# Patient Record
Sex: Male | Born: 1995 | Race: Black or African American | Hispanic: No | Marital: Single | Smoking: Never smoker
Health system: Southern US, Community
[De-identification: ages and names within clinical notes are randomized; demographics above are authoritative.]

## PROBLEM LIST (undated history)

## (undated) DIAGNOSIS — J45909 Unspecified asthma, uncomplicated: Secondary | ICD-10-CM

---

## 2018-06-18 ENCOUNTER — Emergency Department (HOSPITAL_COMMUNITY)
Admission: EM | Admit: 2018-06-18 | Discharge: 2018-06-18 | Disposition: A | Discharge status: Home / Self Care | Payer: Self-pay | Attending: Emergency Medicine | Admitting: Emergency Medicine

## 2018-06-18 ENCOUNTER — Other Ambulatory Visit: Payer: Self-pay

## 2018-06-18 ENCOUNTER — Encounter (HOSPITAL_COMMUNITY): Payer: Self-pay

## 2018-06-18 DIAGNOSIS — X100XXA Contact with hot drinks, initial encounter: Secondary | ICD-10-CM | POA: Insufficient documentation

## 2018-06-18 DIAGNOSIS — Y999 Unspecified external cause status: Secondary | ICD-10-CM | POA: Insufficient documentation

## 2018-06-18 DIAGNOSIS — T24202A Burn of second degree of unspecified site of left lower limb, except ankle and foot, initial encounter: Secondary | ICD-10-CM | POA: Insufficient documentation

## 2018-06-18 DIAGNOSIS — Y929 Unspecified place or not applicable: Secondary | ICD-10-CM | POA: Insufficient documentation

## 2018-06-18 DIAGNOSIS — Y939 Activity, unspecified: Secondary | ICD-10-CM | POA: Insufficient documentation

## 2018-06-18 DIAGNOSIS — J45909 Unspecified asthma, uncomplicated: Secondary | ICD-10-CM | POA: Insufficient documentation

## 2018-06-18 HISTORY — DX: Unspecified asthma, uncomplicated: J45.909

## 2018-06-18 MED ORDER — OXYCODONE-ACETAMINOPHEN 5-325 MG PO TABS: 1.0000 | ORAL_TABLET | Freq: Once | ORAL | Status: DC

## 2018-06-18 MED ORDER — ACETAMINOPHEN 325 MG PO TABS
650.0000 mg | ORAL_TABLET | Freq: Once | ORAL | Status: AC
  Administered 2018-06-18: 650 mg via ORAL
  Filled 2018-06-18: qty 2
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
## 2018-06-18 NOTE — Discharge Instructions (Addendum)
You may alternate ibuprofen or tylenol for your pain.Please keep left leg elevated while sitting at home. You may apply bacitracin or neosporin to the wound.

## 2018-07-21 ENCOUNTER — Emergency Department (HOSPITAL_COMMUNITY): Payer: Self-pay

## 2018-07-21 ENCOUNTER — Encounter (HOSPITAL_COMMUNITY): Payer: Self-pay | Admitting: Emergency Medicine

## 2018-07-21 ENCOUNTER — Emergency Department (HOSPITAL_COMMUNITY)
Admission: EM | Admit: 2018-07-21 | Discharge: 2018-07-22 | Disposition: A | Discharge status: Home / Self Care | Payer: Self-pay | Attending: Emergency Medicine | Admitting: Emergency Medicine

## 2018-07-21 DIAGNOSIS — J45909 Unspecified asthma, uncomplicated: Secondary | ICD-10-CM | POA: Insufficient documentation

## 2018-07-21 DIAGNOSIS — J039 Acute tonsillitis, unspecified: Secondary | ICD-10-CM | POA: Insufficient documentation

## 2018-07-21 DIAGNOSIS — Z7722 Contact with and (suspected) exposure to environmental tobacco smoke (acute) (chronic): Secondary | ICD-10-CM | POA: Insufficient documentation

## 2018-07-21 DIAGNOSIS — S161XXA Strain of muscle, fascia and tendon at neck level, initial encounter: Secondary | ICD-10-CM

## 2018-07-21 MED ORDER — METHOCARBAMOL 1000 MG/10ML IJ SOLN
1000.0000 mg | Freq: Once | INTRAVENOUS | Status: DC
  Filled 2018-07-21 (×2): qty 10

## 2018-07-21 MED ORDER — FENTANYL CITRATE (PF) 100 MCG/2ML IJ SOLN
50.0000 ug | Freq: Once | INTRAMUSCULAR | Status: AC
  Administered 2018-07-21: 50 ug via INTRAVENOUS
  Filled 2018-07-21: qty 2

## 2018-07-21 MED ORDER — METHOCARBAMOL 1000 MG/10ML IJ SOLN: 1000.0000 mg | Freq: Once | INTRAMUSCULAR | Status: DC

## 2018-07-21 NOTE — ED Notes (Signed)
Pt complains of right arm pain and asking for pain medication.
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
## 2018-07-22 ENCOUNTER — Emergency Department (HOSPITAL_COMMUNITY): Payer: Self-pay

## 2018-07-22 LAB — CBC WITH DIFFERENTIAL/PLATELET
Abs Immature Granulocytes: 0.02 10*3/uL (ref 0.00–0.07)
Basophils Absolute: 0 10*3/uL (ref 0.0–0.1)
Basophils Relative: 1 %
Eosinophils Absolute: 0.3 10*3/uL (ref 0.0–0.5)
Eosinophils Relative: 5 %
HCT: 40.2 % (ref 39.0–52.0)
Hemoglobin: 13.7 g/dL (ref 13.0–17.0)
Immature Granulocytes: 0 %
Lymphocytes Relative: 23 %
Lymphs Abs: 1.5 10*3/uL (ref 0.7–4.0)
MCH: 30.9 pg (ref 26.0–34.0)
MCHC: 34.1 g/dL (ref 30.0–36.0)
MCV: 90.5 fL (ref 80.0–100.0)
Monocytes Absolute: 0.3 10*3/uL (ref 0.1–1.0)
Monocytes Relative: 5 %
Neutro Abs: 4.4 10*3/uL (ref 1.7–7.7)
Neutrophils Relative %: 66 %
Platelets: 276 10*3/uL (ref 150–400)
RBC: 4.44 MIL/uL (ref 4.22–5.81)
RDW: 11.7 % (ref 11.5–15.5)
WBC: 6.5 10*3/uL (ref 4.0–10.5)
nRBC: 0 % (ref 0.0–0.2)

## 2018-07-22 LAB — I-STAT CHEM 8, ED
BUN: 9 mg/dL (ref 6–20)
Calcium, Ion: 1.25 mmol/L (ref 1.15–1.40)
Chloride: 102 mmol/L (ref 98–111)
Creatinine, Ser: 1.1 mg/dL (ref 0.61–1.24)
Glucose, Bld: 88 mg/dL (ref 70–99)
HCT: 41 % (ref 39.0–52.0)
Hemoglobin: 13.9 g/dL (ref 13.0–17.0)
Potassium: 3.3 mmol/L — ABNORMAL LOW (ref 3.5–5.1)
Sodium: 140 mmol/L (ref 135–145)
TCO2: 27 mmol/L (ref 22–32)

## 2018-07-22 LAB — ETHANOL: Alcohol, Ethyl (B): <10 mg/dL (ref <10)

## 2018-07-22 MED ORDER — HYDROCODONE-ACETAMINOPHEN 5-325 MG PO TABS: 1.0000 | ORAL_TABLET | Freq: Once | ORAL | Status: DC

## 2018-07-22 MED ORDER — CYCLOBENZAPRINE HCL 10 MG PO TABS: 10.0000 mg | ORAL_TABLET | Freq: Two times a day (BID) | ORAL | 0 refills | Status: DC | PRN

## 2018-07-22 MED ORDER — HYDROMORPHONE HCL 1 MG/ML IJ SOLN
1.0000 mg | Freq: Once | INTRAMUSCULAR | Status: AC
  Administered 2018-07-22: 1 mg via INTRAVENOUS
  Filled 2018-07-22: qty 1

## 2018-07-22 MED ORDER — CYCLOBENZAPRINE HCL 10 MG PO TABS
10.0000 mg | ORAL_TABLET | Freq: Once | ORAL | Status: AC
  Administered 2018-07-22: 10 mg via ORAL
  Filled 2018-07-22: qty 1
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
## 2018-07-22 NOTE — ED Notes (Signed)
EDP at bedside  

## 2018-07-22 NOTE — ED Provider Notes (Signed)
I assumed care in sign out to follow-up on imaging.  CT head/C-spine negative.  Shoulder x-rays negative.  Patient feels improved, he has full range of motion of his extremities.  He has no other acute complaints.  He feels comfortable with discharge home. Is off work for the next 3 days.  I advised no strength training for the next several days to help with his neck pain improve.   Zadie Rhine, MD 07/22/18 [phone]
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
## 2018-11-05 ENCOUNTER — Other Ambulatory Visit: Payer: Self-pay

## 2018-11-05 ENCOUNTER — Emergency Department (HOSPITAL_COMMUNITY)
Admission: EM | Admit: 2018-11-05 | Discharge: 2018-11-05 | Disposition: A | Discharge status: Home / Self Care | Payer: PRIVATE HEALTH INSURANCE | Attending: Emergency Medicine | Admitting: Emergency Medicine

## 2018-11-05 ENCOUNTER — Encounter (HOSPITAL_COMMUNITY): Payer: Self-pay | Admitting: Emergency Medicine

## 2018-11-05 DIAGNOSIS — J45909 Unspecified asthma, uncomplicated: Secondary | ICD-10-CM | POA: Insufficient documentation

## 2018-11-05 DIAGNOSIS — J111 Influenza due to unidentified influenza virus with other respiratory manifestations: Secondary | ICD-10-CM | POA: Diagnosis not present

## 2018-11-05 DIAGNOSIS — R0981 Nasal congestion: Secondary | ICD-10-CM | POA: Diagnosis present

## 2018-11-05 DIAGNOSIS — R69 Illness, unspecified: Secondary | ICD-10-CM

## 2018-11-05 MED ORDER — OSELTAMIVIR PHOSPHATE 75 MG PO CAPS: 75.0000 mg | ORAL_CAPSULE | Freq: Two times a day (BID) | ORAL | 0 refills | Status: AC

## 2018-11-05 NOTE — ED Triage Notes (Signed)
Pt reports flu-like symptoms that started at 0900 on 1/24/220. Pt states he felt feverish but never took a temperature.

## 2018-11-05 NOTE — ED Provider Notes (Signed)
[facility] EMERGENCY DEPARTMENT Provider Note   CSN: [id number] Arrival date & time: 11/05/18  1779   History   Chief Complaint Chief Complaint  Patient presents with  . Flu-Like Symptoms    HPI Richard Lester is a 23 y.o. male with past medical history significant for asthma who presents for evaluation of flulike symptoms.  Patient states he developed flulike symptoms approximately 24 hours ago.  Patient states these were sudden onset.  Has felt warm, however has not taken his temperature.  Patient with nasal congestion, rhinorrhea, cough productive of light yellow sputum, sore throat.  Patient is had multiple sick contacts including known influenza exposure.  Did not obtain his influenza vaccine.  Has been able to tolerate p.o. intake without difficulty.  Denies chills, headache, vision changes, neck pain, neck stiffness, chest pain, shortness of breath, wheezing, nausea, vomiting, abdominal pain, dysuria, diarrhea.  Has not taken anything for his symptoms.  Denies additional aggravating or alleviating factors.  Patient states he has not had to use his albuterol rescue inhaler for asthma since onset of symptoms.  History provided by patient.  No interpreter was used.  HPI  Past Medical History:  Diagnosis Date  . Asthma     There are no active problems to display for this patient.   History reviewed. No pertinent surgical history.      Home Medications    Prior to Admission medications   Medication Sig Start Date End Date Taking? Authorizing Provider  cyclobenzaprine (FLEXERIL) 10 MG tablet Take 1 tablet (10 mg total) by mouth 2 (two) times daily as needed for muscle spasms. 07/22/18   Zadie Rhine, MD  oseltamivir (TAMIFLU) 75 MG capsule Take 1 capsule (75 mg total) by mouth 2 (two) times daily for 5 days. 11/05/18 11/10/18  Henderly, Britni A, PA-C    Family History No family history on file.  Social History Social History   Tobacco Use  .  Smoking status: Never Smoker  . Smokeless tobacco: Never Used  Substance Use Topics  . Alcohol use: Never    Frequency: Never  . Drug use: Never     Allergies   Patient has no known allergies.   Review of Systems Review of Systems  Constitutional:       Subjective fever.  HENT: Positive for congestion, postnasal drip, rhinorrhea, sinus pressure and sore throat. Negative for drooling, ear discharge, ear pain, facial swelling, nosebleeds, sinus pain, sneezing, tinnitus, trouble swallowing and voice change.   Eyes: Negative.   Respiratory: Positive for cough. Negative for choking, chest tightness, shortness of breath, wheezing and stridor.   Cardiovascular: Negative.   Gastrointestinal: Negative.   Genitourinary: Negative.   Musculoskeletal: Negative.   Skin: Negative.   Neurological: Negative.      Physical Exam Updated Vital Signs BP 124/81 (BP Location: Right Arm)   Pulse 82   Temp 98.3 F (36.8 C)   Resp 18   Ht 5\' 11"  (1.803 m)   Wt 77.1 kg   SpO2 100%   BMI 23.71 kg/m   Physical Exam Vitals signs and nursing note reviewed.  Constitutional:      General: He is not in acute distress.    Appearance: Normal appearance. He is well-developed. He is not ill-appearing, toxic-appearing or diaphoretic.  HENT:     Head: Normocephalic and atraumatic.     Jaw: There is normal jaw occlusion.     Right Ear: Tympanic membrane, ear canal and external ear normal. There is no  impacted cerumen. Tympanic membrane is not erythematous, retracted or bulging.     Left Ear: Tympanic membrane, ear canal and external ear normal. There is no impacted cerumen. Tympanic membrane is not erythematous, retracted or bulging.     Nose: Congestion and rhinorrhea present.     Right Sinus: Frontal sinus tenderness present. No maxillary sinus tenderness.     Left Sinus: Frontal sinus tenderness present. No maxillary sinus tenderness.     Mouth/Throat:     Lips: Pink.     Mouth: Mucous membranes  are moist.     Pharynx: Uvula midline.     Tonsils: No tonsillar exudate or tonsillar abscesses. Swelling: 0 on the right. 0 on the left.     Comments: Posterior oropharynx with erythema without exudate.  Uvula midline without deviation.  Tonsils without edema or exudate.  No evidence of RPA, PTA.  No drooling, dysphasia, trismus.  Phonation normal. Eyes:     Pupils: Pupils are equal, round, and reactive to light.  Neck:     Musculoskeletal: Full passive range of motion without pain, normal range of motion and neck supple.     Trachea: Trachea and phonation normal.     Comments: No neck stiffness or neck rigidity. Cardiovascular:     Rate and Rhythm: Normal rate and regular rhythm.     Pulses: Normal pulses.     Heart sounds: Normal heart sounds.  Pulmonary:     Effort: Pulmonary effort is normal. No respiratory distress.     Breath sounds: Normal breath sounds and air entry.     Comments: Clear to auscultation bilaterally without wheeze, rhonchi or rales.  Able to speak in full sentences without difficulty.  No accessory muscle usage. Abdominal:     General: There is no distension.     Palpations: Abdomen is soft.     Comments: Soft, nontender without rebound or guarding.  Musculoskeletal: Normal range of motion.     Comments: Moves all extremities without difficulty.  Skin:    General: Skin is warm and dry.     Comments: No rashes.  Neurological:     Mental Status: He is alert.     Comments: Ambulates in department without difficulty.      ED Treatments / Results  Labs (all labs ordered are listed, but only abnormal results are displayed) Labs Reviewed - No data to display  EKG None  Radiology No results found.  Procedures Procedures (including critical care time)  Medications Ordered in ED Medications - No data to display   Initial Impression / Assessment and Plan / ED Course  I have reviewed the triage vital signs and the nursing notes.  Pertinent labs &  imaging results that were available during my care of the patient were reviewed by me and considered in my medical decision making (see chart for details).  23 year old male who appears otherwise well presents for evaluation of flulike illness.  Afebrile, nonseptic, non-ill-appearing.  Multiple sick contacts including known influenza exposure.  Did not receive influenza vaccine.  Able to tolerate p.o. intake without difficulty.  No drooling, dysphasia, trismus.  Uvula midline without deviation.  Tonsils without edema or exudate.  Low suspicion for strep pharyngitis, PTA, RPA.  Lungs clear to auscultation bilaterally without wheeze, rhonchi or rales.  No evidence of acute respiratory distress.  Low suspicion for pneumonia.  Do not feel we need to obtain chest x-ray at this time.  No neck stiffness or neck rigidity, low suspicion for meningitis.  Patient is within treatment window for Tamiflu.  Given patient has history of asthma discussed risk versus benefit of Tamiflu.  Patient requesting prescription. Has not had to use his albuterol rescue inhaler.  He is hemodynamically stable and appropriate for DC home at this time.  Vital signs stable.  Low suspicion for emergent pathology causing patient's symptoms at this time that would require inpatient management or further evaluation in the emergency department.  Discussed return precautions.  Patient voiced understanding and is agreeable for follow-up.     Final Clinical Impressions(s) / ED Diagnoses   Final diagnoses:  Influenza-like illness    ED Discharge Orders         Ordered    oseltamivir (TAMIFLU) 75 MG capsule  2 times daily     11/05/18 0840           Henderly, Britni A, PA-C 11/05/18 0845    Alvira MondaySchlossman, Erin, MD 11/08/18 2203

## 2018-11-05 NOTE — Discharge Instructions (Signed)
Evaluated today for flulike illness.  I have prescribed you Tamiflu.  Please take as prescribed.  If you develop headache, nausea, vomiting or diarrhea, please stop taking Tamiflu.  Please make sure to adequately hydrate so you do not become dehydrated.  Follow-up with PCP for reevaluation next 2 to 3 days if you are still having symptoms.  Return to ED for new or worsening symptoms

## 2018-11-05 NOTE — ED Notes (Signed)
Declined W/C at D/C and was escorted to lobby by RN. 

## 2019-01-25 ENCOUNTER — Other Ambulatory Visit: Payer: Self-pay

## 2019-01-25 ENCOUNTER — Encounter (HOSPITAL_COMMUNITY): Payer: Self-pay

## 2019-01-25 ENCOUNTER — Emergency Department (HOSPITAL_COMMUNITY)
Admission: EM | Admit: 2019-01-25 | Discharge: 2019-01-25 | Disposition: A | Discharge status: Home / Self Care | Payer: PRIVATE HEALTH INSURANCE | Attending: Emergency Medicine | Admitting: Emergency Medicine

## 2019-01-25 DIAGNOSIS — R112 Nausea with vomiting, unspecified: Secondary | ICD-10-CM

## 2019-01-25 DIAGNOSIS — R197 Diarrhea, unspecified: Secondary | ICD-10-CM | POA: Insufficient documentation

## 2019-01-25 DIAGNOSIS — E860 Dehydration: Secondary | ICD-10-CM

## 2019-01-25 LAB — URINALYSIS, ROUTINE W REFLEX MICROSCOPIC
Bilirubin Urine: NEGATIVE
Glucose, UA: NEGATIVE mg/dL
Hgb urine dipstick: NEGATIVE
Ketones, ur: NEGATIVE mg/dL
Nitrite: NEGATIVE
Protein, ur: NEGATIVE mg/dL
Specific Gravity, Urine: 1.02 (ref 1.005–1.030)
pH: 6 (ref 5.0–8.0)

## 2019-01-25 LAB — CBC WITH DIFFERENTIAL/PLATELET
Abs Immature Granulocytes: 0.01 10*3/uL (ref 0.00–0.07)
Basophils Absolute: 0 10*3/uL (ref 0.0–0.1)
Basophils Relative: 1 %
Eosinophils Absolute: 0.2 10*3/uL (ref 0.0–0.5)
Eosinophils Relative: 3 %
HCT: 44.8 % (ref 39.0–52.0)
Hemoglobin: 15 g/dL (ref 13.0–17.0)
Immature Granulocytes: 0 %
Lymphocytes Relative: 31 %
Lymphs Abs: 1.9 10*3/uL (ref 0.7–4.0)
MCH: 30.4 pg (ref 26.0–34.0)
MCHC: 33.5 g/dL (ref 30.0–36.0)
MCV: 90.9 fL (ref 80.0–100.0)
Monocytes Absolute: 0.4 10*3/uL (ref 0.1–1.0)
Monocytes Relative: 7 %
Neutro Abs: 3.6 10*3/uL (ref 1.7–7.7)
Neutrophils Relative %: 58 %
Platelets: 266 10*3/uL (ref 150–400)
RBC: 4.93 MIL/uL (ref 4.22–5.81)
RDW: 12 % (ref 11.5–15.5)
WBC: 6 10*3/uL (ref 4.0–10.5)
nRBC: 0 % (ref 0.0–0.2)

## 2019-01-25 LAB — COMPREHENSIVE METABOLIC PANEL
ALT: 12 U/L (ref 0–44)
AST: 18 U/L (ref 15–41)
Albumin: 3.7 g/dL (ref 3.5–5.0)
Alkaline Phosphatase: 48 U/L (ref 38–126)
Anion gap: 8 (ref 5–15)
BUN: 7 mg/dL (ref 6–20)
CO2: 22 mmol/L (ref 22–32)
Calcium: 8.6 mg/dL — ABNORMAL LOW (ref 8.9–10.3)
Chloride: 108 mmol/L (ref 98–111)
Creatinine, Ser: 1.08 mg/dL (ref 0.61–1.24)
GFR calc Af Amer: >60 mL/min (ref >60)
GFR calc non Af Amer: >60 mL/min (ref >60)
Glucose, Bld: 82 mg/dL (ref 70–99)
Potassium: 4 mmol/L (ref 3.5–5.1)
Sodium: 138 mmol/L (ref 135–145)
Total Bilirubin: 1 mg/dL (ref 0.3–1.2)
Total Protein: 6.4 g/dL — ABNORMAL LOW (ref 6.5–8.1)

## 2019-01-25 LAB — LIPASE, BLOOD: Lipase: 23 U/L (ref 11–51)

## 2019-01-25 MED ORDER — ONDANSETRON HCL 4 MG/2ML IJ SOLN
4.0000 mg | Freq: Once | INTRAMUSCULAR | Status: AC
  Administered 2019-01-25: 4 mg via INTRAVENOUS
  Filled 2019-01-25: qty 2

## 2019-01-25 MED ORDER — ONDANSETRON 4 MG PO TBDP: 4.0000 mg | ORAL_TABLET | Freq: Three times a day (TID) | ORAL | 0 refills | Status: DC | PRN

## 2019-01-25 MED ORDER — SODIUM CHLORIDE 0.9 % IV BOLUS
1000.0000 mL | Freq: Once | INTRAVENOUS | Status: AC
  Administered 2019-01-25: 1000 mL via INTRAVENOUS

## 2019-01-25 NOTE — ED Notes (Signed)
ED Provider at bedside. 
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
## 2019-02-02 ENCOUNTER — Ambulatory Visit (HOSPITAL_COMMUNITY)
Admission: EM | Admit: 2019-02-02 | Discharge: 2019-02-02 | Disposition: A | Discharge status: Home / Self Care | Payer: PRIVATE HEALTH INSURANCE | Attending: Internal Medicine | Admitting: Internal Medicine

## 2019-02-02 ENCOUNTER — Ambulatory Visit (INDEPENDENT_AMBULATORY_CARE_PROVIDER_SITE_OTHER): Payer: PRIVATE HEALTH INSURANCE

## 2019-02-02 ENCOUNTER — Encounter (HOSPITAL_COMMUNITY): Payer: Self-pay

## 2019-02-02 ENCOUNTER — Other Ambulatory Visit: Payer: Self-pay

## 2019-02-02 DIAGNOSIS — R1011 Right upper quadrant pain: Secondary | ICD-10-CM | POA: Diagnosis not present

## 2019-02-02 DIAGNOSIS — R1031 Right lower quadrant pain: Secondary | ICD-10-CM

## 2019-02-02 LAB — BASIC METABOLIC PANEL WITH GFR
Anion gap: 6 (ref 5–15)
BUN: 10 mg/dL (ref 6–20)
CO2: 28 mmol/L (ref 22–32)
Calcium: 9.9 mg/dL (ref 8.9–10.3)
Chloride: 104 mmol/L (ref 98–111)
Creatinine, Ser: 1.17 mg/dL (ref 0.61–1.24)
GFR calc Af Amer: >60 mL/min
GFR calc non Af Amer: >60 mL/min
Glucose, Bld: 87 mg/dL (ref 70–99)
Potassium: 4.5 mmol/L (ref 3.5–5.1)
Sodium: 138 mmol/L (ref 135–145)

## 2019-02-02 LAB — CBC WITH DIFFERENTIAL/PLATELET
Abs Immature Granulocytes: 0.01 K/uL (ref 0.00–0.07)
Basophils Absolute: 0 K/uL (ref 0.0–0.1)
Basophils Relative: 1 %
Eosinophils Absolute: 0.2 K/uL (ref 0.0–0.5)
Eosinophils Relative: 4 %
HCT: 41.7 % (ref 39.0–52.0)
Hemoglobin: 14.2 g/dL (ref 13.0–17.0)
Immature Granulocytes: 0 %
Lymphocytes Relative: 27 %
Lymphs Abs: 1.6 K/uL (ref 0.7–4.0)
MCH: 30.7 pg (ref 26.0–34.0)
MCHC: 34.1 g/dL (ref 30.0–36.0)
MCV: 90.3 fL (ref 80.0–100.0)
Monocytes Absolute: 0.5 K/uL (ref 0.1–1.0)
Monocytes Relative: 9 %
Neutro Abs: 3.5 K/uL (ref 1.7–7.7)
Neutrophils Relative %: 59 %
Platelets: 254 K/uL (ref 150–400)
RBC: 4.62 MIL/uL (ref 4.22–5.81)
RDW: 12 % (ref 11.5–15.5)
WBC: 5.9 K/uL (ref 4.0–10.5)
nRBC: 0 % (ref 0.0–0.2)

## 2019-02-02 LAB — POCT URINALYSIS DIP (DEVICE)
Bilirubin Urine: NEGATIVE
Glucose, UA: NEGATIVE mg/dL
Hgb urine dipstick: NEGATIVE
Ketones, ur: NEGATIVE mg/dL
Leukocytes,Ua: NEGATIVE
Nitrite: NEGATIVE
Protein, ur: NEGATIVE mg/dL
Specific Gravity, Urine: 1.02 (ref 1.005–1.030)
Urobilinogen, UA: 0.2 mg/dL (ref 0.0–1.0)
pH: 7 (ref 5.0–8.0)

## 2019-02-02 LAB — LIPASE, BLOOD: Lipase: 26 U/L (ref 11–51)

## 2019-02-02 LAB — AMYLASE: Amylase: 105 U/L — ABNORMAL HIGH (ref 28–100)

## 2019-02-02 MED ORDER — POLYETHYLENE GLYCOL 3350 17 GM/SCOOP PO POWD: 1.0000 | Freq: Once | ORAL | 0 refills | Status: AC

## 2019-02-02 NOTE — ED Provider Notes (Addendum)
[facility]    CSN: [id number] Arrival date & time: 02/02/19  1356     History   Chief Complaint Chief Complaint  Patient presents with  . Abdominal Pain    HPI Richard Lester is a 23 y.o. male.   Patient is a 23 year old male with past medical history of asthma.  He presents today with continued abdominal discomfort and generalized not feeling well.  He was seen in the ER 8 days ago and treated for nausea, vomiting and diarrhea.  He is reporting that his nausea and vomiting has subsided but he is still having persistent right upper quadrant discomfort.  Describes it as aching.  Denies any diarrhea.  Last BM was yesterday and he reports this as normal.  He denies being a current everyday drinker.  He has been eating and drinking but decreased.  No fevers, chills, body aches, night sweats.        Past Medical History:  Diagnosis Date  . Asthma     There are no active problems to display for this patient.   History reviewed. No pertinent surgical history.     Home Medications    Prior to Admission medications   Medication Sig Start Date End Date Taking? Authorizing Provider  cyclobenzaprine (FLEXERIL) 10 MG tablet Take 1 tablet (10 mg total) by mouth 2 (two) times daily as needed for muscle spasms. 07/22/18   Zadie Rhine, MD  ondansetron (ZOFRAN ODT) 4 MG disintegrating tablet Take 1 tablet (4 mg total) by mouth every 8 (eight) hours as needed. 01/25/19   Jacalyn Lefevre, MD    Family History History reviewed. No pertinent family history.  Social History Social History   Tobacco Use  . Smoking status: Never Smoker  . Smokeless tobacco: Never Used  Substance Use Topics  . Alcohol use: Never    Frequency: Never  . Drug use: Never     Allergies   Patient has no known allergies.   Review of Systems Review of Systems  Constitutional: Positive for appetite change. Negative for fatigue and fever.  Respiratory: Negative for shortness of  breath.   Cardiovascular: Negative for chest pain.  Gastrointestinal: Positive for abdominal pain. Negative for blood in stool, diarrhea, nausea, rectal pain and vomiting.  Genitourinary: Negative for decreased urine volume, difficulty urinating, discharge, dysuria, enuresis, flank pain, frequency, genital sores, hematuria, penile pain, penile swelling, scrotal swelling, testicular pain and urgency.  Musculoskeletal: Negative for myalgias.  Skin: Negative for rash.     Physical Exam Triage Vital Signs ED Triage Vitals  Enc Vitals Group     BP 02/02/19 1418 123/68     Pulse Rate 02/02/19 1418 (!) 55     Resp 02/02/19 1418 18     Temp 02/02/19 1418 (!) 97.1 F (36.2 C)     Temp src --      SpO2 02/02/19 1418 99 %     Weight --      Height --      Head Circumference --      Peak Flow --      Pain Score 02/02/19 1420 8     Pain Loc --      Pain Edu? --      Excl. in GC? --    No data found.  Updated Vital Signs BP 123/68   Pulse (!) 55   Temp (!) 97.1 F (36.2 C)   Resp 18   SpO2 99%   Visual Acuity Right Eye Distance:  Left Eye Distance:   Bilateral Distance:    Right Eye Near:   Left Eye Near:    Bilateral Near:     Physical Exam Vitals signs and nursing note reviewed.  Constitutional:      General: He is not in acute distress.    Appearance: He is well-developed. He is not ill-appearing, toxic-appearing or diaphoretic.  HENT:     Head: Normocephalic and atraumatic.     Mouth/Throat:     Pharynx: Oropharynx is clear.  Cardiovascular:     Rate and Rhythm: Normal rate and regular rhythm.  Pulmonary:     Effort: Pulmonary effort is normal.     Breath sounds: Normal breath sounds.  Abdominal:     General: Abdomen is flat. Bowel sounds are normal.     Palpations: Abdomen is soft.     Tenderness: There is abdominal tenderness in the right upper quadrant and left upper quadrant. There is no right CVA tenderness, left CVA tenderness, guarding or rebound.  Negative signs include Murphy's sign.  Skin:    General: Skin is warm and dry.  Neurological:     Mental Status: He is alert.  Psychiatric:        Mood and Affect: Mood normal.      UC Treatments / Results  Labs (all labs ordered are listed, but only abnormal results are displayed) Labs Reviewed  AMYLASE - Abnormal; Notable for the following components:      Result Value   Amylase 105 (*)    All other components within normal limits  CBC WITH DIFFERENTIAL/PLATELET  BASIC METABOLIC PANEL  LIPASE, BLOOD  POCT URINALYSIS DIP (DEVICE)    EKG None  Radiology Dg Abd 2 Views  Result Date: 02/02/2019 CLINICAL DATA:  23 year old male with stomach pain EXAM: ABDOMEN - 2 VIEW COMPARISON:  None. FINDINGS: The bowel gas pattern is normal. There is no evidence of free air. No radio-opaque calculi or other significant radiographic abnormality is seen. IMPRESSION: Negative. Electronically Signed   By: Gilmer MorJaime  Wagner D.O.   On: 02/02/2019 16:01    Procedures Procedures (including critical care time)  Medications Ordered in UC Medications - No data to display  Initial Impression / Assessment and Plan / UC Course  I have reviewed the triage vital signs and the nursing notes.  Pertinent labs & imaging results that were available during my care of the patient were reviewed by me and considered in my medical decision making (see chart for details).    RUQ pain  Patient is a 23 year old male with generalized upper abdominal tenderness, more on the right. He was seen in the ER 8 days ago and treated for nausea, vomiting, diarrhea. Most of the symptoms subsided except for the upper abdominal discomfort. Lab work here today was unremarkable X-ray revealed moderate stool burden in the upper abdomen more on the right.  This is most likely cause of his discomfort. We will have him do MiraLAX for constipation Instructed that if his symptoms continue or worsen he will need to go the ER for  further evaluation and management Pt understanding and agreed.  Final Clinical Impressions(s) / UC Diagnoses   Final diagnoses:  Right lower quadrant abdominal pain     Discharge Instructions     Your lab work was normal I believe that your right upper abdominal pain is related to some constipation I would like for you to try MiraLAX for your symptoms 1 capful twice a day until you have a good  bowel movement and then you can back down to 1 capful daily Make sure you are drinking plenty of water and staying hydrated Follow up as needed for continued or worsening symptoms     ED Prescriptions    Medication Sig Dispense Auth. Provider   polyethylene glycol powder (GLYCOLAX/MIRALAX) 17 GM/SCOOP powder Take 255 g by mouth once for 1 dose. 1 capful with water by mouth twice a day until you have a good bowel movement and then 1 capful daily 255 g Dahlia Byes A, NP     Controlled Substance Prescriptions [place] Controlled Substance Registry consulted? Not Applicable   Janace Aris, NP 02/03/19 0900    Dahlia Byes A, NP 02/03/19 [phone]

## 2019-02-02 NOTE — Discharge Instructions (Addendum)
Your lab work was normal I believe that your right upper abdominal pain is related to some constipation I would like for you to try MiraLAX for your symptoms 1 capful twice a day until you have a good bowel movement and then you can back down to 1 capful daily Make sure you are drinking plenty of water and staying hydrated Follow up as needed for continued or worsening symptoms

## 2019-02-02 NOTE — ED Triage Notes (Signed)
Pt was seen in ED last week, for abdonial pain and vomiting, states still doesn't feel well, denies current vomiting and appetite is fair. BM yesterday

## 2019-02-06 ENCOUNTER — Other Ambulatory Visit: Payer: Self-pay

## 2019-02-06 ENCOUNTER — Emergency Department (HOSPITAL_COMMUNITY)
Admission: EM | Admit: 2019-02-06 | Discharge: 2019-02-06 | Disposition: A | Discharge status: Home / Self Care | Payer: PRIVATE HEALTH INSURANCE | Attending: Emergency Medicine | Admitting: Emergency Medicine

## 2019-02-06 ENCOUNTER — Encounter (HOSPITAL_COMMUNITY): Payer: Self-pay

## 2019-02-06 ENCOUNTER — Ambulatory Visit (INDEPENDENT_AMBULATORY_CARE_PROVIDER_SITE_OTHER)
Admission: EM | Admit: 2019-02-06 | Discharge: 2019-02-06 | Disposition: A | Discharge status: Home / Self Care | Payer: PRIVATE HEALTH INSURANCE | Source: Home / Self Care | Attending: Family Medicine | Admitting: Family Medicine

## 2019-02-06 DIAGNOSIS — R1084 Generalized abdominal pain: Secondary | ICD-10-CM | POA: Insufficient documentation

## 2019-02-06 DIAGNOSIS — R519 Headache, unspecified: Secondary | ICD-10-CM

## 2019-02-06 DIAGNOSIS — Z79899 Other long term (current) drug therapy: Secondary | ICD-10-CM | POA: Diagnosis not present

## 2019-02-06 DIAGNOSIS — R1011 Right upper quadrant pain: Secondary | ICD-10-CM

## 2019-02-06 DIAGNOSIS — R51 Headache: Secondary | ICD-10-CM | POA: Diagnosis not present

## 2019-02-06 LAB — COMPREHENSIVE METABOLIC PANEL
ALT: 14 U/L (ref 0–44)
AST: 17 U/L (ref 15–41)
Albumin: 4.1 g/dL (ref 3.5–5.0)
Alkaline Phosphatase: 59 U/L (ref 38–126)
Anion gap: 9 (ref 5–15)
BUN: 11 mg/dL (ref 6–20)
CO2: 23 mmol/L (ref 22–32)
Calcium: 9.5 mg/dL (ref 8.9–10.3)
Chloride: 106 mmol/L (ref 98–111)
Creatinine, Ser: 1.18 mg/dL (ref 0.61–1.24)
GFR calc Af Amer: >60 mL/min (ref >60)
GFR calc non Af Amer: >60 mL/min (ref >60)
Glucose, Bld: 89 mg/dL (ref 70–99)
Potassium: 4 mmol/L (ref 3.5–5.1)
Sodium: 138 mmol/L (ref 135–145)
Total Bilirubin: 0.7 mg/dL (ref 0.3–1.2)
Total Protein: 7.1 g/dL (ref 6.5–8.1)

## 2019-02-06 LAB — CBC
HCT: 42.4 % (ref 39.0–52.0)
Hemoglobin: 14.2 g/dL (ref 13.0–17.0)
MCH: 30.3 pg (ref 26.0–34.0)
MCHC: 33.5 g/dL (ref 30.0–36.0)
MCV: 90.4 fL (ref 80.0–100.0)
Platelets: 249 10*3/uL (ref 150–400)
RBC: 4.69 MIL/uL (ref 4.22–5.81)
RDW: 11.8 % (ref 11.5–15.5)
WBC: 5.3 10*3/uL (ref 4.0–10.5)
nRBC: 0 % (ref 0.0–0.2)

## 2019-02-06 LAB — URINALYSIS, ROUTINE W REFLEX MICROSCOPIC
Bilirubin Urine: NEGATIVE
Glucose, UA: NEGATIVE mg/dL
Hgb urine dipstick: NEGATIVE
Ketones, ur: NEGATIVE mg/dL
Leukocytes,Ua: NEGATIVE
Nitrite: NEGATIVE
Protein, ur: NEGATIVE mg/dL
Specific Gravity, Urine: 1.018 (ref 1.005–1.030)
pH: 6 (ref 5.0–8.0)

## 2019-02-06 LAB — LIPASE, BLOOD: Lipase: 26 U/L (ref 11–51)

## 2019-02-06 MED ORDER — DICYCLOMINE HCL 10 MG PO CAPS
10.0000 mg | ORAL_CAPSULE | Freq: Once | ORAL | Status: AC
  Administered 2019-02-06: 10 mg via ORAL
  Filled 2019-02-06: qty 1

## 2019-02-06 MED ORDER — DICYCLOMINE HCL 20 MG PO TABS: 20.0000 mg | ORAL_TABLET | Freq: Two times a day (BID) | ORAL | 0 refills | Status: AC

## 2019-02-06 MED ORDER — SODIUM CHLORIDE 0.9% FLUSH: 3.0000 mL | Freq: Once | INTRAVENOUS | Status: DC

## 2019-02-06 NOTE — Discharge Instructions (Addendum)
You need to go to the emergency room for further work-up of your abdominal pain

## 2019-02-06 NOTE — ED Triage Notes (Addendum)
Patient presents to Urgent Care with complaints of recurrent headache and lower abdominal pain since today while at work. Patient states he took phenergan without relief today. Dr. Delton See bedside at this time, and due to pt's recurrent visits is recommending the patient be seen in the ED today for further evaluation.

## 2019-02-06 NOTE — ED Provider Notes (Addendum)
[facility]    CSN: [id number] Arrival date & time: 02/06/19  1737     History   Chief Complaint Chief Complaint  Patient presents with  . Abdominal Pain  . Headache    HPI Richard Lester is a 23 y.o. male.   HPI  Patient is here for follow-up of abdominal pain.  He was seen on 01/25/2019, then again on 02/02/2019 and still is having abdominal pain.  He states the pain is there daily.  He states the pain is severe.  He states he is trying to eat but he has some nausea.  No vomiting today.  He states he is having bowel movements.  No blood in bowels.  In spite of this he gets spells of crampy severe abdominal pain.  He has had an x-ray, and screening laboratory including liver functions and pancreatic enzymes.  His white count was normal.  Past Medical History:  Diagnosis Date  . Asthma     There are no active problems to display for this patient.   History reviewed. No pertinent surgical history.     Home Medications    Prior to Admission medications   Medication Sig Start Date End Date Taking? Authorizing Provider  ondansetron (ZOFRAN ODT) 4 MG disintegrating tablet Take 1 tablet (4 mg total) by mouth every 8 (eight) hours as needed. 01/25/19   Jacalyn Lefevre, MD    Family History History reviewed. No pertinent family history.  Social History Social History   Tobacco Use  . Smoking status: Never Smoker  . Smokeless tobacco: Never Used  Substance Use Topics  . Alcohol use: Never    Frequency: Never  . Drug use: Never     Allergies   Patient has no known allergies.   Review of Systems Review of Systems  Constitutional: Positive for appetite change. Negative for chills and fever.  HENT: Negative for ear pain and sore throat.   Eyes: Negative for pain and visual disturbance.  Respiratory: Negative for cough and shortness of breath.   Cardiovascular: Negative for chest pain and palpitations.  Gastrointestinal: Positive for abdominal pain  and nausea. Negative for vomiting.  Genitourinary: Negative for dysuria and hematuria.  Musculoskeletal: Negative for arthralgias and back pain.  Skin: Negative for color change and rash.  Neurological: Negative for seizures and syncope.  All other systems reviewed and are negative.    Physical Exam Triage Vital Signs ED Triage Vitals  Enc Vitals Group     BP 02/06/19 1759 120/73     Pulse Rate 02/06/19 1759 64     Resp 02/06/19 1759 16     Temp 02/06/19 1759 98.5 F (36.9 C)     Temp Source 02/06/19 1759 Oral     SpO2 02/06/19 1759 100 %     Weight --      Height --      Head Circumference --      Peak Flow --      Pain Score 02/06/19 1811 8     Pain Loc --      Pain Edu? --      Excl. in GC? --    No data found.  Updated Vital Signs BP 120/73 (BP Location: Right Arm)   Pulse 64   Temp 98.5 F (36.9 C) (Oral)   Resp 16   SpO2 100%       Physical Exam Constitutional:      General: He is not in acute distress.    Appearance:  He is well-developed. He is ill-appearing.     Comments: Uncomfortable.  Bent over holding abdomen  HENT:     Head: Normocephalic and atraumatic.  Eyes:     Conjunctiva/sclera: Conjunctivae normal.     Pupils: Pupils are equal, round, and reactive to light.  Neck:     Musculoskeletal: Normal range of motion.  Cardiovascular:     Rate and Rhythm: Normal rate.  Pulmonary:     Effort: Pulmonary effort is normal. No respiratory distress.  Abdominal:     General: Bowel sounds are normal. There is no distension.     Palpations: Abdomen is soft. There is no hepatomegaly or splenomegaly.     Tenderness: There is abdominal tenderness in the right upper quadrant. There is no guarding or rebound.     Hernia: No hernia is present.  Musculoskeletal: Normal range of motion.  Skin:    General: Skin is warm and dry.  Neurological:     General: No focal deficit present.     Mental Status: He is alert.  Psychiatric:        Mood and Affect: Mood  normal.        Behavior: Behavior normal.      UC Treatments / Results  Labs (all labs ordered are listed, but only abnormal results are displayed) Labs Reviewed - No data to display  EKG None  Radiology No results found.  Procedures Procedures (including critical care time)  Medications Ordered in UC Medications - No data to display  Initial Impression / Assessment and Plan / UC Course  I have reviewed the triage vital signs and the nursing notes.  Pertinent labs & imaging results that were available during my care of the patient were reviewed by me and considered in my medical decision making (see chart for details).     Patient appears acutely uncomfortable.  We have already done screening laboratory work and x-ray.  That is the limit of what we can provide for him at the urgent care center.  I explained to him that he would be better served at the emergency room where imaging can be done for him if indicated.  Patient agrees to go Final Clinical Impressions(s) / UC Diagnoses   Final diagnoses:  Right upper quadrant abdominal pain     Discharge Instructions     You need to go to the emergency room for further work-up of your abdominal pain    ED Prescriptions    None     Controlled Substance Prescriptions [place] Controlled Substance Registry consulted? No   Eustace MooreNelson, Shantana Christon Sue, MD 02/06/19 1843    Eustace MooreNelson, Annaliesa Blann Sue, MD 02/06/19 2045

## 2019-02-06 NOTE — ED Provider Notes (Signed)
[facility] EMERGENCY DEPARTMENT Provider Note   CSN: [id number] Arrival date & time: 02/06/19  1224    History   Chief Complaint Chief Complaint  Patient presents with  . Abdominal Pain  . Headache    HPI Shonta Meli is a 23 y.o. male.     HPI  Patient presents concern of abdominal pain and headache. Onset was about 2 weeks ago, without clear precipitant. Since onset he has had symptoms persistently, though waxing and waning severity. Headache is 7/10, abdominal pain is 4/10, with pain across the upper abdomen, described as intermittent, sore, crampy. Some associated loose stool, no vomiting, though is nauseous, and complains of anorexia. Patient was well prior to the onset of symptoms, denies medical problems, denies drugs, alcohol, tobacco use. He denies recent travel, new food exposure. He does have a brother who has abdominal pain as well. Patient has seen physicians on 3 occasions during this illness including of the onset, 2 days ago, and again today at urgent care. He has had multiple laboratory studies performed, has had x-rays performed, reportedly with reassuring results.   Past Medical History:  Diagnosis Date  . Asthma     There are no active problems to display for this patient.   History reviewed. No pertinent surgical history.      Home Medications    Prior to Admission medications   Medication Sig Start Date End Date Taking? Authorizing Provider  dicyclomine (BENTYL) 20 MG tablet Take 1 tablet (20 mg total) by mouth 2 (two) times daily. 02/06/19   Gerhard Munch, MD  ondansetron (ZOFRAN ODT) 4 MG disintegrating tablet Take 1 tablet (4 mg total) by mouth every 8 (eight) hours as needed. 01/25/19   Jacalyn Lefevre, MD    Family History History reviewed. No pertinent family history.  Social History Social History   Tobacco Use  . Smoking status: Never Smoker  . Smokeless tobacco: Never Used  Substance Use Topics   . Alcohol use: Never    Frequency: Never  . Drug use: Never     Allergies   Patient has no known allergies.   Review of Systems Review of Systems  Constitutional:       Per HPI, otherwise negative  HENT:       Per HPI, otherwise negative  Respiratory:       Per HPI, otherwise negative  Cardiovascular:       Per HPI, otherwise negative  Gastrointestinal: Positive for abdominal pain, diarrhea and nausea. Negative for vomiting.  Endocrine:       Negative aside from HPI  Genitourinary:       Neg aside from HPI   Musculoskeletal:       Per HPI, otherwise negative  Skin: Negative.   Neurological: Negative for syncope.     Physical Exam Updated Vital Signs BP 120/81 (BP Location: Right Arm)   Pulse (!) 56   Temp 98.4 F (36.9 C) (Oral)   Resp 17   SpO2 100%   Physical Exam Vitals signs and nursing note reviewed.  Constitutional:      General: He is not in acute distress.    Appearance: He is well-developed.  HENT:     Head: Normocephalic and atraumatic.  Eyes:     Conjunctiva/sclera: Conjunctivae normal.  Cardiovascular:     Rate and Rhythm: Normal rate and regular rhythm.  Pulmonary:     Effort: Pulmonary effort is normal. No respiratory distress.     Breath sounds: No stridor.  Abdominal:     General: There is no distension.     Comments: Minimal tenderness across the upper abdomen, without guarding, rebound, peritoneal findings.  Skin:    General: Skin is warm and dry.     Comments: Multiple tattoos  Neurological:     Mental Status: He is alert and oriented to person, place, and time.      ED Treatments / Results  Labs (all labs ordered are listed, but only abnormal results are displayed) Labs Reviewed  LIPASE, BLOOD  COMPREHENSIVE METABOLIC PANEL  CBC  URINALYSIS, ROUTINE W REFLEX MICROSCOPIC    EKG None  Radiology No results found.  Procedures Procedures (including critical care time)  Medications Ordered in ED Medications  sodium  chloride flush (NS) 0.9 % injection 3 mL (has no administration in time range)  dicyclomine (BENTYL) capsule 10 mg (has no administration in time range)     Initial Impression / Assessment and Plan / ED Course  I have reviewed the triage vital signs and the nursing notes.  Pertinent labs & imaging results that were available during my care of the patient were reviewed by me and considered in my medical decision making (see chart for details).  After the initial evaluation reviewed the patient chart with him in the room, reviewing lab studies from urgent care, both last week and today. We discussed all findings, which are reassuring. Vital signs unremarkable. Given his persistent abdominal pain, loose stool, advocated for CT abdomen pelvis for further evaluation, management per Patient deferred this recommendation, stated that he wished to receive oral medication, be discharged, to recover at home. Encouraged him to return for concerning changes, otherwise follow-up with GI. Patient started to take medication as prescribed, including pantoprazole which she received earlier today. With no peritoneal findings, no bacteremia, sepsis evidence exists either, patient discharged in stable condition, per his request.  Final Clinical Impressions(s) / ED Diagnoses   Final diagnoses:  Generalized abdominal pain  Bad headache    ED Discharge Orders         Ordered    dicyclomine (BENTYL) 20 MG tablet  2 times daily     02/06/19 2104           Gerhard MunchLockwood, Mena Lienau, MD 02/06/19 2114

## 2019-02-06 NOTE — Discharge Instructions (Addendum)
As discussed, your evaluation today has been largely reassuring.  But, it is important that you monitor your condition carefully, and do not hesitate to return to the ED if you develop new, or concerning changes in your condition.  Please be sure to take your medication that you began taking today in addition to this new prescription.

## 2019-02-06 NOTE — ED Notes (Signed)
Patient ambulatory to ED for further examination and workup per UCP.

## 2019-02-06 NOTE — ED Notes (Signed)
Patient verbalizes understanding of discharge instructions. Opportunity for questioning and answers were provided. Armband removed by staff, pt discharged from ED ambulatory.   

## 2019-02-06 NOTE — ED Triage Notes (Signed)
Pt reports abdominal pain and headache X2 weeks. Pt denies nausea or vomiting. Pt ambulatory. Reports headache is a new symptom.

## 2019-03-09 ENCOUNTER — Other Ambulatory Visit: Payer: Self-pay

## 2019-03-09 ENCOUNTER — Encounter (HOSPITAL_COMMUNITY): Payer: Self-pay

## 2019-03-09 ENCOUNTER — Ambulatory Visit (HOSPITAL_COMMUNITY)
Admission: EM | Admit: 2019-03-09 | Discharge: 2019-03-09 | Disposition: A | Discharge status: Home / Self Care | Payer: PRIVATE HEALTH INSURANCE | Attending: Family Medicine | Admitting: Family Medicine

## 2019-03-09 DIAGNOSIS — K529 Noninfective gastroenteritis and colitis, unspecified: Secondary | ICD-10-CM | POA: Diagnosis not present

## 2019-03-09 MED ORDER — ONDANSETRON 4 MG PO TBDP: 4.0000 mg | ORAL_TABLET | Freq: Three times a day (TID) | ORAL | 0 refills | Status: DC | PRN

## 2019-03-09 NOTE — ED Triage Notes (Signed)
Pt cc pt states he went to [facility] over the weekend and for 4 days now he has had diarrhea. Pt has a headaches as well. Pt needs a work note.

## 2019-03-09 NOTE — ED Provider Notes (Signed)
[facility]   [id number] 03/09/19 Arrival Time: 1050  ASSESSMENT & PLAN:  1. Gastroenteritis    Stable/resolving. No signs of dehydration requiring IVF. Work note provided.  Meds ordered this encounter  Medications  . ondansetron (ZOFRAN-ODT) 4 MG disintegrating tablet    Sig: Take 1 tablet (4 mg total) by mouth every 8 (eight) hours as needed for nausea or vomiting.    Dispense:  15 tablet    Refill:  0   Discussed typical duration of symptoms for suspected viral GI illness. Will do his best to ensure adequate fluid intake in order to avoid dehydration. Will proceed to the Emergency Department for evaluation if unable to tolerate PO fluids regularly.  Otherwise he will f/u with his PCP or here if not showing improvement over the next 48-72 hours.  Reviewed expectations re: course of current medical issues. Questions answered. Outlined signs and symptoms indicating need for more acute intervention. Patient verbalized understanding. After Visit Summary given.   SUBJECTIVE: History from: patient.  Richard Lester is a 23 y.o. male who presents with complaint of non-bilious, non-bloody intermittent nausea and vomiting of brown material with non-bloody diarrhea. More diarrhea now. Onset 1-2 days ago. Abdominal discomfort: mild and cramping. Symptoms are gradually improving since beginning. Aggravating factors: eating. Alleviating factors: none identified. Associated symptoms: fatigue. He denies arthralgias, belching, chills, fever and myalgias. Appetite: decreased. PO intake: decreased. Ambulatory without assistance. Urinary symptoms: none. Sick contacts: none. Recent travel or camping: none. OTC treatment: none.  No LMP for male patient.  History reviewed. No pertinent surgical history.  ROS: As per HPI.  OBJECTIVE:  Vitals:   03/09/19 1105 03/09/19 1106  BP:  115/74  Pulse:  66  Resp:  16  Temp:  98.3 F (36.8 C)  TempSrc:  Oral  SpO2:  98%  Weight:  77.1 kg     General appearance: alert; no distress Oropharynx: moist Lungs: clear to auscultation bilaterally; unlabored Heart: regular rate and rhythm Abdomen: soft; non-distended; no significant abdominal tenderness; reports "cramping" feeling; bowel sounds present; no masses or organomegaly; no guarding or rebound tenderness Back: no CVA tenderness Extremities: no edema; symmetrical with no gross deformities Skin: warm; dry Neurologic: normal gait Psychological: alert and cooperative; normal mood and affect   No Known Allergies                                             Past Medical History:  Diagnosis Date  . Asthma    Social History   Socioeconomic History  . Marital status: Single    Spouse name: Not on file  . Number of children: Not on file  . Years of education: Not on file  . Highest education level: Not on file  Occupational History  . Not on file  Social Needs  . Financial resource strain: Not on file  . Food insecurity:    Worry: Not on file    Inability: Not on file  . Transportation needs:    Medical: Not on file    Non-medical: Not on file  Tobacco Use  . Smoking status: Never Smoker  . Smokeless tobacco: Never Used  Substance and Sexual Activity  . Alcohol use: Never    Frequency: Never  . Drug use: Never  . Sexual activity: Not on file  Lifestyle  . Physical activity:    Days per week:  Not on file    Minutes per session: Not on file  . Stress: Not on file  Relationships  . Social connections:    Talks on phone: Not on file    Gets together: Not on file    Attends religious service: Not on file    Active member of club or organization: Not on file    Attends meetings of clubs or organizations: Not on file    Relationship status: Not on file  . Intimate partner violence:    Fear of current or ex partner: Not on file    Emotionally abused: Not on file    Physically abused: Not on file    Forced sexual activity: Not on file  Other Topics  Concern  . Not on file  Social History Narrative  . Not on file   History reviewed. No pertinent family history.   Mardella Layman, MD 03/09/19 1115

## 2019-03-09 NOTE — Discharge Instructions (Signed)

## 2019-03-22 ENCOUNTER — Ambulatory Visit (HOSPITAL_COMMUNITY)
Admission: EM | Admit: 2019-03-22 | Discharge: 2019-03-22 | Disposition: A | Discharge status: Home / Self Care | Payer: PRIVATE HEALTH INSURANCE | Attending: Family Medicine | Admitting: Family Medicine

## 2019-03-22 ENCOUNTER — Telehealth: Payer: Self-pay

## 2019-03-22 ENCOUNTER — Encounter (HOSPITAL_COMMUNITY): Payer: Self-pay

## 2019-03-22 ENCOUNTER — Other Ambulatory Visit: Payer: Self-pay

## 2019-03-22 DIAGNOSIS — R519 Headache, unspecified: Secondary | ICD-10-CM

## 2019-03-22 DIAGNOSIS — R112 Nausea with vomiting, unspecified: Secondary | ICD-10-CM

## 2019-03-22 DIAGNOSIS — Z20822 Contact with and (suspected) exposure to covid-19: Secondary | ICD-10-CM

## 2019-03-22 MED ORDER — ONDANSETRON 4 MG PO TBDP
4.0000 mg | ORAL_TABLET | Freq: Once | ORAL | Status: AC
  Administered 2019-03-22: 4 mg via ORAL

## 2019-03-22 MED ORDER — ACETAMINOPHEN 325 MG PO TABS
ORAL_TABLET | ORAL | Status: AC
  Filled 2019-03-22: qty 3

## 2019-03-22 MED ORDER — ACETAMINOPHEN 325 MG PO TABS
975.0000 mg | ORAL_TABLET | Freq: Once | ORAL | Status: AC
  Administered 2019-03-22: 975 mg via ORAL

## 2019-03-22 MED ORDER — ONDANSETRON 4 MG PO TBDP
ORAL_TABLET | ORAL | Status: AC
  Filled 2019-03-22: qty 1

## 2019-03-22 NOTE — ED Notes (Signed)
Patient able to ambulate independently  

## 2019-03-22 NOTE — Discharge Instructions (Signed)
Small frequent sips of fluids- Pedialyte, Gatorade, water, broth- to maintain hydration.   Zofran every 8 hours as needed for nausea or vomiting.   Bland diet as tolerated.  In presence of covid-19 pandemic with new onset of symptoms I do recommend testing before return to work.  You will be called to set up appointment to be tested at [facility]. Takes 2-3 days for test results.  Tylenol as needed for headache.  If any worsening of symptoms or no improvement please return or go to the ER.

## 2019-03-22 NOTE — ED Triage Notes (Signed)
Pt states he has a headache x 4 days. Pt states he got food poison over the weekend. Pt states he feels bad. Pt states he needs a work note.

## 2019-03-22 NOTE — ED Provider Notes (Signed)
[facility]    CSN: [id number] Arrival date & time: 03/22/19  1106     History   Chief Complaint Chief Complaint  Patient presents with  . Headache    HPI Richard KaysKadarius Byard is a 23 y.o. male.   Richard Lester presents with complaints of nausea vomiting and headache. States it all started after eating out 6/4. Vomited last yesterday. Still with mild nausea. Headache. Upper abdominal pain. No diarrhea. No fevers. Has had some cough and congestion. No shortness of breath . Uses daily inhaler. No known ill contacts. No blood in emesis. No known ill contacts. No dizziness. Headache 7/10 in severity. Hasn't taken any medications for symptoms. No vision changes. Has had headaches in the past. Works around a lot of others at [employer]. Hx of asthma.    ROS per HPI, negative if not otherwise mentioned.      Past Medical History:  Diagnosis Date  . Asthma     There are no active problems to display for this patient.   History reviewed. No pertinent surgical history.     Home Medications    Prior to Admission medications   Medication Sig Start Date End Date Taking? Authorizing Provider  budesonide-formoterol (SYMBICORT) 80-4.5 MCG/ACT inhaler Inhale into the lungs 2 (two) times daily.   Yes [provider]  dicyclomine (BENTYL) 20 MG tablet Take 1 tablet (20 mg total) by mouth 2 (two) times daily. 02/06/19   Gerhard MunchLockwood, Robert, MD  ondansetron (ZOFRAN-ODT) 4 MG disintegrating tablet Take 1 tablet (4 mg total) by mouth every 8 (eight) hours as needed for nausea or vomiting. 03/09/19   Mardella LaymanHagler, Brian, MD    Family History History reviewed. No pertinent family history.  Social History Social History   Tobacco Use  . Smoking status: Never Smoker  . Smokeless tobacco: Never Used  Substance Use Topics  . Alcohol use: Never    Frequency: Never  . Drug use: Never     Allergies   Patient has no known allergies.   Review of Systems Review of  Systems   Physical Exam Triage Vital Signs ED Triage Vitals  Enc Vitals Group     BP 03/22/19 1147 (!) 151/112     Pulse Rate 03/22/19 1147 74     Resp 03/22/19 1147 18     Temp 03/22/19 1147 98.3 F (36.8 C)     Temp Source 03/22/19 1147 Oral     SpO2 03/22/19 1147 100 %     Weight 03/22/19 1145 170 lb (77.1 kg)     Height --      Head Circumference --      Peak Flow --      Pain Score 03/22/19 1145 6     Pain Loc --      Pain Edu? --      Excl. in GC? --    No data found.  Updated Vital Signs BP (!) 151/112 (BP Location: Right Arm)   Pulse 74   Temp 98.3 F (36.8 C) (Oral)   Resp 18   Wt 170 lb (77.1 kg)   SpO2 100%   BMI 23.71 kg/m    Physical Exam Constitutional:      Appearance: He is well-developed.  Cardiovascular:     Rate and Rhythm: Normal rate.  Pulmonary:     Effort: Pulmonary effort is normal.  Abdominal:     General: There is no distension.     Palpations: Abdomen is soft.  Tenderness: There is abdominal tenderness in the epigastric area. There is no guarding.  Skin:    General: Skin is warm and dry.  Neurological:     Mental Status: He is alert and oriented to person, place, and time.      UC Treatments / Results  Labs (all labs ordered are listed, but only abnormal results are displayed) Labs Reviewed - No data to display  EKG None  Radiology No results found.  Procedures Procedures (including critical care time)  Medications Ordered in UC Medications  acetaminophen (TYLENOL) tablet 975 mg (975 mg Oral Given 03/22/19 1206)  ondansetron (ZOFRAN-ODT) disintegrating tablet 4 mg (4 mg Oral Given 03/22/19 1207)    Initial Impression / Assessment and Plan / UC Course  I have reviewed the triage vital signs and the nursing notes.  Pertinent labs & imaging results that were available during my care of the patient were reviewed by me and considered in my medical decision making (see chart for details).    5-6 days of symptoms  which he attributes to food he had eaten. After visit, reviewed chart further and appears he had similar 5/28 as well, about a week prior to when these current symptoms started, and similar multiple times over the past two months. With covid pandemic and work around a lot of others I do recommend screening. Vitals stable at this time. Afebrile. Noted elevated BP, this new today. Hasn't taken any medications for symptoms. Return precautions provided. Patient verbalized understanding and agreeable to plan.   Final Clinical Impressions(s) / UC Diagnoses   Final diagnoses:  Intractable vomiting with nausea, unspecified vomiting type  Bad headache     Discharge Instructions     Small frequent sips of fluids- Pedialyte, Gatorade, water, broth- to maintain hydration.   Zofran every 8 hours as needed for nausea or vomiting.   Bland diet as tolerated.  In presence of covid-19 pandemic with new onset of symptoms I do recommend testing before return to work.  You will be called to set up appointment to be tested at [facility]. Takes 2-3 days for test results.  Tylenol as needed for headache.  If any worsening of symptoms or no improvement please return or go to the ER.     ED Prescriptions    None     Controlled Substance Prescriptions [place] Controlled Substance Registry consulted? Not Applicable   Zigmund Gottron, NP 03/22/19 1230

## 2019-03-22 NOTE — Telephone Encounter (Addendum)
Patient called and advised of the request for covid testing, appointment scheduled for tomorrow, 03/23/19 at 1545 at [facility], advised of location and to wear a mask for everyone in the vehicle, he verbalized understanding. Order placed.   ----- Message from Zigmund Gottron, NP sent at 03/22/2019 11:58 AM EDT ----- Regarding: Covid testing needed

## 2019-03-23 ENCOUNTER — Other Ambulatory Visit: Payer: PRIVATE HEALTH INSURANCE

## 2019-03-23 DIAGNOSIS — Z20822 Contact with and (suspected) exposure to covid-19: Secondary | ICD-10-CM

## 2019-03-25 LAB — NOVEL CORONAVIRUS, NAA: SARS-CoV-2, NAA: NOT DETECTED

## 2019-03-29 ENCOUNTER — Telehealth: Payer: Self-pay | Admitting: General Practice

## 2019-03-29 NOTE — Telephone Encounter (Signed)
Needs paper showing negative covid19 to return to work. No MyChart, printed and mailed results to his address.

## 2019-06-21 DIAGNOSIS — Z5321 Procedure and treatment not carried out due to patient leaving prior to being seen by health care provider: Secondary | ICD-10-CM | POA: Insufficient documentation

## 2019-06-22 ENCOUNTER — Encounter (HOSPITAL_COMMUNITY): Payer: Self-pay

## 2019-06-22 ENCOUNTER — Other Ambulatory Visit: Payer: Self-pay

## 2019-06-22 ENCOUNTER — Emergency Department (HOSPITAL_COMMUNITY)
Admission: EM | Admit: 2019-06-22 | Discharge: 2019-06-22 | Discharge status: Against Medical Advice | Payer: Self-pay | Attending: Emergency Medicine | Admitting: Emergency Medicine

## 2019-06-22 ENCOUNTER — Ambulatory Visit (HOSPITAL_COMMUNITY)
Admission: EM | Admit: 2019-06-22 | Discharge: 2019-06-22 | Disposition: A | Discharge status: Home / Self Care | Payer: Self-pay | Attending: Internal Medicine | Admitting: Internal Medicine

## 2019-06-22 ENCOUNTER — Encounter (HOSPITAL_COMMUNITY): Payer: Self-pay | Admitting: Emergency Medicine

## 2019-06-22 ENCOUNTER — Emergency Department (HOSPITAL_COMMUNITY): Payer: Self-pay

## 2019-06-22 DIAGNOSIS — J4531 Mild persistent asthma with (acute) exacerbation: Secondary | ICD-10-CM

## 2019-06-22 LAB — CBC
HCT: 44.4 % (ref 39.0–52.0)
Hemoglobin: 14.9 g/dL (ref 13.0–17.0)
MCH: 30.5 pg (ref 26.0–34.0)
MCHC: 33.6 g/dL (ref 30.0–36.0)
MCV: 91 fL (ref 80.0–100.0)
Platelets: 280 10*3/uL (ref 150–400)
RBC: 4.88 MIL/uL (ref 4.22–5.81)
RDW: 11.9 % (ref 11.5–15.5)
WBC: 7.5 10*3/uL (ref 4.0–10.5)
nRBC: 0 % (ref 0.0–0.2)

## 2019-06-22 LAB — BASIC METABOLIC PANEL
Anion gap: 8 (ref 5–15)
BUN: 7 mg/dL (ref 6–20)
CO2: 27 mmol/L (ref 22–32)
Calcium: 9.6 mg/dL (ref 8.9–10.3)
Chloride: 103 mmol/L (ref 98–111)
Creatinine, Ser: 1.26 mg/dL — ABNORMAL HIGH (ref 0.61–1.24)
GFR calc Af Amer: >60 mL/min (ref >60)
GFR calc non Af Amer: >60 mL/min (ref >60)
Glucose, Bld: 71 mg/dL (ref 70–99)
Potassium: 3.6 mmol/L (ref 3.5–5.1)
Sodium: 138 mmol/L (ref 135–145)

## 2019-06-22 LAB — TROPONIN I (HIGH SENSITIVITY): Troponin I (High Sensitivity): 4 ng/L (ref <18)

## 2019-06-22 MED ORDER — SODIUM CHLORIDE 0.9% FLUSH: 3.0000 mL | Freq: Once | INTRAVENOUS | Status: DC

## 2019-06-22 MED ORDER — PREDNISONE 20 MG PO TABS: 20.0000 mg | ORAL_TABLET | Freq: Every day | ORAL | 0 refills | Status: AC

## 2019-06-22 MED ORDER — ALBUTEROL SULFATE HFA 108 (90 BASE) MCG/ACT IN AERS: 1.0000 | INHALATION_SPRAY | Freq: Four times a day (QID) | RESPIRATORY_TRACT | 0 refills | Status: DC | PRN

## 2019-06-22 NOTE — ED Provider Notes (Signed)
[place]    CSN: [id number] Arrival date & time: 06/22/19  1902      History   Chief Complaint Chief Complaint  Patient presents with  . Chest Pain  . Asthma    HPI Richard Lester is a 23 y.o. male with history of asthma comes to the urgent care with complaints of central chest pain, shortness of breath and wheezing of 2 weeks duration.  Patient has a cough which is nonproductive.  He currently uses Symbicort at home for asthma control.  Denies any albuterol inhaler use or any nebulizer use.  No fever or chills.  No nausea or vomiting.  No dizziness, near syncope or syncopal episode.  Patient has been seen in the ED and chest pain work-up was negative.Marland Kitchen   HPI  Past Medical History:  Diagnosis Date  . Asthma     There are no active problems to display for this patient.   History reviewed. No pertinent surgical history.     Home Medications    Prior to Admission medications   Medication Sig Start Date End Date Taking? Authorizing Provider  albuterol (VENTOLIN HFA) 108 (90 Base) MCG/ACT inhaler Inhale 1-2 puffs into the lungs every 6 (six) hours as needed for wheezing or shortness of breath. 06/22/19   Chase Picket, MD  budesonide-formoterol (SYMBICORT) 80-4.5 MCG/ACT inhaler Inhale into the lungs 2 (two) times daily.    [provider]  dicyclomine (BENTYL) 20 MG tablet Take 1 tablet (20 mg total) by mouth 2 (two) times daily. 02/06/19   Carmin Muskrat, MD  ondansetron (ZOFRAN-ODT) 4 MG disintegrating tablet Take 1 tablet (4 mg total) by mouth every 8 (eight) hours as needed for nausea or vomiting. 03/09/19   Vanessa Kick, MD  predniSONE (DELTASONE) 20 MG tablet Take 1 tablet (20 mg total) by mouth daily for 5 days. 06/22/19 06/27/19  LampteyMyrene Galas, MD    Family History History reviewed. No pertinent family history.  Social History Social History   Tobacco Use  . Smoking status: Never Smoker  . Smokeless tobacco: Never Used   Substance Use Topics  . Alcohol use: Never    Frequency: Never  . Drug use: Never     Allergies   Patient has no known allergies.   Review of Systems Review of Systems  Constitutional: Negative.   HENT: Negative for congestion, ear discharge, ear pain, postnasal drip, rhinorrhea, sinus pressure, sinus pain and sore throat.   Respiratory: Positive for cough, chest tightness and wheezing. Negative for choking.   Cardiovascular: Positive for chest pain. Negative for palpitations.  Gastrointestinal: Negative.   Endocrine: Negative.   Genitourinary: Negative.   Musculoskeletal: Negative.   Skin: Negative.   Neurological: Negative.      Physical Exam Triage Vital Signs ED Triage Vitals  Enc Vitals Group     BP 06/22/19 1921 116/68     Pulse Rate 06/22/19 1921 62     Resp 06/22/19 1921 14     Temp 06/22/19 1921 98.4 F (36.9 C)     Temp Source 06/22/19 1921 Temporal     SpO2 06/22/19 1921 100 %     Weight --      Height --      Head Circumference --      Peak Flow --      Pain Score 06/22/19 1930 6     Pain Loc --      Pain Edu? --      Excl. in [place]? --  No data found.  Updated Vital Signs BP 116/68 (BP Location: Left Arm)   Pulse 62   Temp 98.4 F (36.9 C) (Temporal)   Resp 14   SpO2 100%   Visual Acuity Right Eye Distance:   Left Eye Distance:   Bilateral Distance:    Right Eye Near:   Left Eye Near:    Bilateral Near:     Physical Exam Constitutional:      Appearance: He is well-developed.  Cardiovascular:     Rate and Rhythm: Normal rate.     Heart sounds: Normal heart sounds. Heart sounds not distant. No murmur.  Pulmonary:     Effort: Pulmonary effort is normal.     Breath sounds: Wheezing present. No decreased breath sounds, rhonchi or rales.  Skin:    General: Skin is warm.     Capillary Refill: Capillary refill takes less than 2 seconds.  Neurological:     General: No focal deficit present.     Mental Status: He is alert.      UC  Treatments / Results  Labs (all labs ordered are listed, but only abnormal results are displayed) Labs Reviewed - No data to display  EKG   Radiology No results found.  Procedures Procedures (including critical care time)  Medications Ordered in UC Medications - No data to display  Initial Impression / Assessment and Plan / UC Course  I have reviewed the triage vital signs and the nursing notes.  Pertinent labs & imaging results that were available during my care of the patient were reviewed by me and considered in my medical decision making (see chart for details).     1. Mild persistent asthma with acute exacerbation: Start albuterol inhaler every 6 hours as needed Prednisone 20 mg orally daily for 5 days Patient is advised to resume Symbicort use If patient's symptoms gets worse he is advised to return to the urgent care to be reevaluated.  Final Clinical Impressions(s) / UC Diagnoses   Final diagnoses:  Mild persistent asthma with acute exacerbation   Discharge Instructions   None    ED Prescriptions    Medication Sig Dispense Auth. Provider   albuterol (VENTOLIN HFA) 108 (90 Base) MCG/ACT inhaler Inhale 1-2 puffs into the lungs every 6 (six) hours as needed for wheezing or shortness of breath. 6.7 g Diego Delancey, Britta MccreedyPhilip O, MD   predniSONE (DELTASONE) 20 MG tablet Take 1 tablet (20 mg total) by mouth daily for 5 days. 5 tablet Nimrod Wendt, Britta MccreedyPhilip O, MD     Controlled Substance Prescriptions [place] Controlled Substance Registry consulted? No   Merrilee JanskyLamptey, Malory Spurr O, MD 06/27/19 1031

## 2019-06-22 NOTE — ED Triage Notes (Signed)
Pt presents with central chest pain, shortness of breath, and wheezing X 2 weeks.  Pt has not had his inhaler or nebulizer machine in a while.

## 2019-06-22 NOTE — ED Triage Notes (Signed)
Patient reports intermittent central chest pain with mild SOB onset this week , denies emesis or diaphoresis , no cough or congestion .

## 2019-06-22 NOTE — ED Notes (Signed)
Called pt for vitals, no answer x3.

## 2020-08-19 ENCOUNTER — Other Ambulatory Visit: Payer: Self-pay

## 2020-08-28 ENCOUNTER — Encounter: Payer: Self-pay | Admitting: Medical

## 2020-10-16 ENCOUNTER — Encounter (HOSPITAL_COMMUNITY): Payer: Self-pay | Admitting: Emergency Medicine

## 2020-10-16 ENCOUNTER — Ambulatory Visit (HOSPITAL_COMMUNITY)
Admission: EM | Admit: 2020-10-16 | Discharge: 2020-10-16 | Disposition: A | Discharge status: Home / Self Care | Payer: Self-pay

## 2020-10-16 DIAGNOSIS — Z8709 Personal history of other diseases of the respiratory system: Secondary | ICD-10-CM

## 2020-10-16 DIAGNOSIS — Z760 Encounter for issue of repeat prescription: Secondary | ICD-10-CM

## 2020-10-16 HISTORY — DX: Unspecified asthma, uncomplicated: J45.909

## 2020-10-16 MED ORDER — ALBUTEROL SULFATE 108 (90 BASE) MCG/ACT IN AEPB: 1.0000 | INHALATION_SPRAY | RESPIRATORY_TRACT | 0 refills | Status: DC | PRN

## 2020-10-16 NOTE — ED Provider Notes (Signed)
[facility]    CSN: [id number] Arrival date & time: 10/16/20  5176      History   Chief Complaint Chief Complaint  Patient presents with  . Medication Refill    HPI Richard Lester is a 25 y.o. male.   Richard Lester presents with requests for an inhaler refill for prn use. States he started a new job and has concerns it could trigger his asthma, so would like an inhaler for as needed use. History of asthma. Denies any current wheezing or shortness of breath . No cough. Doesn't have a pcp.    ROS per HPI, negative if not otherwise mentioned.      Past Medical History:  Diagnosis Date  . Asthma     There are no problems to display for this patient.   History reviewed. No pertinent surgical history.     Home Medications    Prior to Admission medications   Medication Sig Start Date End Date Taking? Authorizing Provider  Albuterol Sulfate (PROAIR RESPICLICK) 108 (90 Base) MCG/ACT AEPB Inhale 1-2 puffs into the lungs every 4 (four) hours as needed (wheezing). 10/16/20  Yes Georgetta Haber, NP    Family History Family History  Problem Relation Age of Onset  . Healthy Mother     Social History Social History   Tobacco Use  . Smoking status: Never Smoker  . Smokeless tobacco: Never Used  Substance Use Topics  . Alcohol use: Never  . Drug use: Never     Allergies   Patient has no known allergies.   Review of Systems Review of Systems   Physical Exam Triage Vital Signs ED Triage Vitals  Enc Vitals Group     BP 10/16/20 0852 113/86     Pulse Rate 10/16/20 0852 62     Resp 10/16/20 0852 18     Temp 10/16/20 0852 99.5 F (37.5 C)     Temp Source 10/16/20 0852 Oral     SpO2 10/16/20 0852 99 %     Weight --      Height --      Head Circumference --      Peak Flow --      Pain Score 10/16/20 0851 0     Pain Loc --      Pain Edu? --      Excl. in GC? --    No data found.  Updated Vital Signs BP 113/86 (BP Location: Right  Arm)   Pulse 62   Temp 99.5 F (37.5 C) (Oral)   Resp 18   SpO2 99%   Visu Physical Exam Constitutional:      Appearance: He is well-developed.  Cardiovascular:     Rate and Rhythm: Normal rate.  Pulmonary:     Effort: Pulmonary effort is normal.  Skin:    General: Skin is warm and dry.  Neurological:     Mental Status: He is alert and oriented to person, place, and time.      UC Treatments / Results  Labs (all labs ordered are listed, but only abnormal results are displayed) Labs Reviewed - No data to display  EKG   Radiology No results found.  Procedures Procedures (including critical care time)  Medications Ordered in UC Medications - No data to display  Initial Impression / Assessment and Plan / UC Course  I have reviewed the triage vital signs and the nursing notes.  Pertinent labs & imaging results that were available during my care of  the patient were reviewed by me and considered in my medical decision making (see chart for details).     No wheezing currently. Refill of inhaler provided today. Encouraged establish with a pcp for long term management. Patient verbalized understanding and agreeable to plan.    Final Clinical Impressions(s) / UC Diagnoses   Final diagnoses:  Medication refill  History of asthma   Discharge Instructions   None    ED Prescriptions    Medication Sig Dispense Auth. Provider   Albuterol Sulfate (PROAIR RESPICLICK) 108 (90 Base) MCG/ACT AEPB Inhale 1-2 puffs into the lungs every 4 (four) hours as needed (wheezing). 1 each Georgetta Haber, NP     PDMP not reviewed this encounter.   Georgetta Haber, NP 10/16/20 [id number]

## 2020-10-16 NOTE — ED Triage Notes (Signed)
Pt states that he needs a refill on his inhaler (ProAir).

## 2020-10-30 ENCOUNTER — Encounter (HOSPITAL_COMMUNITY): Payer: Self-pay

## 2021-01-07 ENCOUNTER — Other Ambulatory Visit: Payer: Self-pay

## 2021-01-07 ENCOUNTER — Encounter (HOSPITAL_COMMUNITY): Payer: Self-pay | Admitting: *Deleted

## 2021-01-07 ENCOUNTER — Ambulatory Visit (HOSPITAL_COMMUNITY)
Admission: EM | Admit: 2021-01-07 | Discharge: 2021-01-07 | Disposition: A | Discharge status: Home / Self Care | Payer: Medicaid Other | Attending: Urgent Care | Admitting: Urgent Care

## 2021-01-07 DIAGNOSIS — R300 Dysuria: Secondary | ICD-10-CM | POA: Insufficient documentation

## 2021-01-07 DIAGNOSIS — R350 Frequency of micturition: Secondary | ICD-10-CM

## 2021-01-07 LAB — POCT URINALYSIS DIPSTICK, ED / UC
Bilirubin Urine: NEGATIVE
Glucose, UA: NEGATIVE mg/dL
Hgb urine dipstick: NEGATIVE
Ketones, ur: NEGATIVE mg/dL
Leukocytes,Ua: NEGATIVE
Nitrite: NEGATIVE
Protein, ur: NEGATIVE mg/dL
Specific Gravity, Urine: 1.025 (ref 1.005–1.030)
Urobilinogen, UA: 0.2 mg/dL (ref 0.0–1.0)
pH: 6.5 (ref 5.0–8.0)

## 2021-01-07 NOTE — Discharge Instructions (Addendum)
Make sure you hydrate very well with plain water and a quantity of 64 ounces of water a day.  Please limit drinks that are considered urinary irritants such as soda, sweet tea, coffee, energy drinks, alcohol.  These can worsen your urinary and genital symptoms but also be the source of them.  I will let you know about your sexually transmitted infection test results through MyChart to see if we need to prescribe or change your antibiotics based off of those results.

## 2021-01-07 NOTE — ED Provider Notes (Signed)
[facility]   MRN: [id number] DOB: [birth year]  Subjective:   Richard Lester is a 25 y.o. male presenting for 2 week history of persistent dysuria, urinary frequency. Denies hematuria, penile discharge, penile swelling, testicular pain, testicular swelling, anal pain, groin pain. He is sexually active with 3 male partners. Uses condoms consistently.  Admits that he rarely drinks water.  Primarily drinks fruit juices, Gatorade.   No current facility-administered medications for this encounter.  Current Outpatient Medications:  .  albuterol (VENTOLIN HFA) 108 (90 Base) MCG/ACT inhaler, Inhale 1-2 puffs into the lungs every 6 (six) hours as needed for wheezing or shortness of breath., Disp: 6.7 g, Rfl: 0 .  Albuterol Sulfate (PROAIR RESPICLICK) 108 (90 Base) MCG/ACT AEPB, Inhale 1-2 puffs into the lungs every 4 (four) hours as needed (wheezing)., Disp: 1 each, Rfl: 0 .  budesonide-formoterol (SYMBICORT) 80-4.5 MCG/ACT inhaler, Inhale into the lungs 2 (two) times daily., Disp: , Rfl:  .  dicyclomine (BENTYL) 20 MG tablet, Take 1 tablet (20 mg total) by mouth 2 (two) times daily., Disp: 20 tablet, Rfl: 0 .  ondansetron (ZOFRAN-ODT) 4 MG disintegrating tablet, Take 1 tablet (4 mg total) by mouth every 8 (eight) hours as needed for nausea or vomiting., Disp: 15 tablet, Rfl: 0   No Known Allergies  Past Medical History:  Diagnosis Date  . Asthma      No past surgical history on file.  Family History  Problem Relation Age of Onset  . Healthy Mother     Social History   Tobacco Use  . Smoking status: Never Smoker  . Smokeless tobacco: Never Used  Vaping Use  . Vaping Use: Never used  Substance Use Topics  . Alcohol use: Never  . Drug use: Never    ROS   Objective:   Vitals: BP 111/67 (BP Location: Left Arm)   Pulse 75   Temp 98.2 F (36.8 C) (Oral)   Resp 18   SpO2 100%   Physical Exam Constitutional:      General: He is not in acute  distress.    Appearance: Normal appearance. He is well-developed and normal weight. He is not ill-appearing, toxic-appearing or diaphoretic.  HENT:     Head: Normocephalic and atraumatic.     Right Ear: External ear normal.     Left Ear: External ear normal.     Nose: Nose normal.     Mouth/Throat:     Pharynx: Oropharynx is clear.  Eyes:     General: No scleral icterus.       Right eye: No discharge.        Left eye: No discharge.     Extraocular Movements: Extraocular movements intact.     Pupils: Pupils are equal, round, and reactive to light.  Cardiovascular:     Rate and Rhythm: Normal rate.  Pulmonary:     Effort: Pulmonary effort is normal.  Genitourinary:    Penis: Circumcised. No phimosis, paraphimosis, hypospadias, erythema, tenderness, discharge, swelling or lesions.   Musculoskeletal:     Cervical back: Normal range of motion.  Neurological:     Mental Status: He is alert and oriented to person, place, and time.  Psychiatric:        Mood and Affect: Mood normal.        Behavior: Behavior normal.        Thought Content: Thought content normal.        Judgment: Judgment normal.     Results  for orders placed or performed during the hospital encounter of 01/07/21 (from the past 24 hour(s))  POC Urinalysis dipstick     Status: None   Collection Time: 01/07/21  9:38 AM  Result Value Ref Range   Glucose, UA NEGATIVE NEGATIVE mg/dL   Bilirubin Urine NEGATIVE NEGATIVE   Ketones, ur NEGATIVE NEGATIVE mg/dL   Specific Gravity, Urine 1.025 1.005 - 1.030   Hgb urine dipstick NEGATIVE NEGATIVE   pH 6.5 5.0 - 8.0   Protein, ur NEGATIVE NEGATIVE mg/dL   Urobilinogen, UA 0.2 0.0 - 1.0 mg/dL   Nitrite NEGATIVE NEGATIVE   Leukocytes,Ua NEGATIVE NEGATIVE    Assessment and Plan :   PDMP not reviewed this encounter.  1. Dysuria   2. Urinary frequency     Emphasized need to hydrate much better with plain water.  Eliminate urinary irritants as discussed.  STI testing  pending.  Will treat as appropriate.  Encouraged continued safe sex practices.  Counseled patient on potential for adverse effects with medications prescribed/recommended today, ER and return-to-clinic precautions discussed, patient verbalized understanding.    Wallis Bamberg, PA-C 01/07/21 1005

## 2021-01-07 NOTE — ED Triage Notes (Signed)
pT REPORTS sX'S STARTED 2 WEEKS AGO

## 2021-01-08 ENCOUNTER — Telehealth (HOSPITAL_COMMUNITY): Payer: Self-pay | Admitting: Emergency Medicine

## 2021-01-08 LAB — CYTOLOGY, (ORAL, ANAL, URETHRAL) ANCILLARY ONLY
Chlamydia: NEGATIVE
Comment: NEGATIVE
Comment: NEGATIVE
Comment: NORMAL
Neisseria Gonorrhea: NEGATIVE
Trichomonas: POSITIVE — AB

## 2021-01-08 MED ORDER — METRONIDAZOLE 500 MG PO TABS: 500.0000 mg | ORAL_TABLET | Freq: Two times a day (BID) | ORAL | 0 refills | Status: DC

## 2021-08-09 ENCOUNTER — Other Ambulatory Visit: Payer: Self-pay

## 2021-08-09 ENCOUNTER — Ambulatory Visit (HOSPITAL_COMMUNITY)
Admission: EM | Admit: 2021-08-09 | Discharge: 2021-08-09 | Disposition: A | Discharge status: Home / Self Care | Payer: PRIVATE HEALTH INSURANCE | Attending: Emergency Medicine | Admitting: Emergency Medicine

## 2021-08-09 ENCOUNTER — Encounter (HOSPITAL_COMMUNITY): Payer: Self-pay

## 2021-08-09 DIAGNOSIS — R300 Dysuria: Secondary | ICD-10-CM | POA: Diagnosis not present

## 2021-08-09 DIAGNOSIS — R1030 Lower abdominal pain, unspecified: Secondary | ICD-10-CM | POA: Diagnosis not present

## 2021-08-09 DIAGNOSIS — R197 Diarrhea, unspecified: Secondary | ICD-10-CM | POA: Insufficient documentation

## 2021-08-09 LAB — POCT URINALYSIS DIPSTICK, ED / UC
Bilirubin Urine: NEGATIVE
Glucose, UA: NEGATIVE mg/dL
Hgb urine dipstick: NEGATIVE
Ketones, ur: NEGATIVE mg/dL
Leukocytes,Ua: NEGATIVE
Nitrite: NEGATIVE
Protein, ur: 100 mg/dL — AB
Specific Gravity, Urine: 1.025 (ref 1.005–1.030)
Urobilinogen, UA: 0.2 mg/dL (ref 0.0–1.0)
pH: 7 (ref 5.0–8.0)

## 2021-08-09 NOTE — Discharge Instructions (Addendum)
The analysis of urine revealed elevated levels of protein which is concerning for dehydration.  Please be sure you are drinking plenty of water and/or Gatorade, particularly since you are having episodes of diarrhea.  There is no concern for urinary tract infection.  The results of your STD screening done today will be made available within the next 48 to 72 hours, you should be contacted by phone with the results and if treatment is required that will be provided for you as well.  Thank you for visiting urgent care today.

## 2021-08-09 NOTE — ED Triage Notes (Signed)
Pt is c/o generalized abdominal pain, diarrhea, and dysuria; pt would like to get checked for STIs; last sexual encounter was 2 weeks ago; 1 male partners; pt denies penile discharge

## 2021-08-09 NOTE — ED Provider Notes (Signed)
[facility]    CSN: [id number] Arrival date & time: 08/09/21  1010      History   Chief Complaint Chief Complaint  Patient presents with   Abdominal Pain   Diarrhea   Dysuria    HPI Richard Lester is a 25 y.o. male.   Patient complains of nation, states this does not happen every time he voids.  Patient does have a history of STD, and was recently tested positive for trichomonas in March 2022.  Patient also reports generalized abdominal pain with 2 episodes of diarrhea daily for the past 3 days, states he is not having copious amounts of liquid stool, denies nausea, vomiting, fever, aches, chills otherwise.  Patient states his last encounter was approximately 2 weeks ago, did not use a condom, states he is only have 1 male partner in the past 6 months and denies penile discharge, inguinal pain, scrotal swelling, scrotal pain.  Urine dip today was remarkable for elevated protein level, otherwise not suspicious for urinary tract infection.    The history is provided by the patient.   Past Medical History:  Diagnosis Date   Asthma     There are no problems to display for this patient.   History reviewed. No pertinent surgical history.     Home Medications    Prior to Admission medications   Medication Sig Start Date End Date Taking? Authorizing Provider  albuterol (VENTOLIN HFA) 108 (90 Base) MCG/ACT inhaler Inhale 1-2 puffs into the lungs every 6 (six) hours as needed for wheezing or shortness of breath. 06/22/19   Merrilee Jansky, MD  Albuterol Sulfate (PROAIR RESPICLICK) 108 (90 Base) MCG/ACT AEPB Inhale 1-2 puffs into the lungs every 4 (four) hours as needed (wheezing). 10/16/20   Georgetta Haber, NP  budesonide-formoterol (SYMBICORT) 80-4.5 MCG/ACT inhaler Inhale into the lungs 2 (two) times daily.    [provider]  dicyclomine (BENTYL) 20 MG tablet Take 1 tablet (20 mg total) by mouth 2 (two) times daily. 02/06/19   Gerhard Munch,  MD    Family History Family History  Problem Relation Age of Onset   Healthy Mother     Social History Social History   Tobacco Use   Smoking status: Never   Smokeless tobacco: Never  Vaping Use   Vaping Use: Never used  Substance Use Topics   Alcohol use: Never   Drug use: Never     Allergies   Patient has no known allergies.   Review of Systems Review of Systems Pertinent findings noted in history of present illness.    Physical Exam Triage Vital Signs ED Triage Vitals  Enc Vitals Group     BP 08/08/21 0827 (!) 147/82     Pulse Rate 08/08/21 0827 72     Resp 08/08/21 0827 18     Temp 08/08/21 0827 98.3 F (36.8 C)     Temp Source 08/08/21 0827 Oral     SpO2 08/08/21 0827 98 %     Weight --      Height --      Head Circumference --      Peak Flow --      Pain Score 08/08/21 0826 5     Pain Loc --      Pain Edu? --      Excl. in GC? --    No data found.  Updated Vital Signs BP 118/73 (BP Location: Left Arm)   Pulse 60   Temp 97.8 F (  36.6 C) (Oral)   Resp 14   SpO2 99%   Visual Acuity Right Eye Distance:   Left Eye Distance:   Bilateral Distance:    Right Eye Near:   Left Eye Near:    Bilateral Near:     Physical Exam Vitals and nursing note reviewed.  Constitutional:      General: He is not in acute distress.    Appearance: Normal appearance. He is not ill-appearing.  HENT:     Head: Normocephalic and atraumatic.  Eyes:     General: Lids are normal.        Right eye: No discharge.        Left eye: No discharge.     Extraocular Movements: Extraocular movements intact.     Conjunctiva/sclera: Conjunctivae normal.     Right eye: Right conjunctiva is not injected.     Left eye: Left conjunctiva is not injected.  Neck:     Trachea: Trachea and phonation normal.  Cardiovascular:     Rate and Rhythm: Normal rate and regular rhythm.     Pulses: Normal pulses.     Heart sounds: Normal heart sounds. No murmur heard.   No friction rub.  No gallop.  Pulmonary:     Effort: Pulmonary effort is normal. No accessory muscle usage, prolonged expiration or respiratory distress.     Breath sounds: Normal breath sounds. No stridor, decreased air movement or transmitted upper airway sounds. No decreased breath sounds, wheezing, rhonchi or rales.  Chest:     Chest wall: No tenderness.  Genitourinary:    Comments: Pt politely declines GU exam, pt did provide a swab for testing.   Musculoskeletal:        General: Normal range of motion.     Cervical back: Normal range of motion and neck supple. Normal range of motion.  Lymphadenopathy:     Cervical: No cervical adenopathy.  Skin:    General: Skin is warm and dry.     Findings: No erythema or rash.  Neurological:     General: No focal deficit present.     Mental Status: He is alert and oriented to person, place, and time.  Psychiatric:        Mood and Affect: Mood normal.        Behavior: Behavior normal.     UC Treatments / Results  Labs (all labs ordered are listed, but only abnormal results are displayed) Labs Reviewed  POCT URINALYSIS DIPSTICK, ED / UC - Abnormal; Notable for the following components:      Result Value   Protein, ur 100 (*)    All other components within normal limits  URINE CULTURE  CYTOLOGY, (ORAL, ANAL, URETHRAL) ANCILLARY ONLY    EKG   Radiology No results found.  Procedures Procedures (including critical care time)  Medications Ordered in UC Medications - No data to display  Initial Impression / Assessment and Plan / UC Course  I have reviewed the triage vital signs and the nursing notes.  Pertinent labs & imaging results that were available during my care of the patient were reviewed by me and considered in my medical decision making (see chart for details).     Patient advised that episodes of diarrhea for the past 3 days is not terribly concerning, urinalysis reveals mild dehydration with elevated level of protein, push clear  fluids and monitor diarrhea for increase in volume and or frequency.  STD testing will be performed as requested, treatment will be  provided as needed. Patient verbalized understanding and agreement of plan as discussed.  All questions were addressed during visit.  Please see discharge instructions below for further details of plan.  Final Clinical Impressions(s) / UC Diagnoses   Final diagnoses:  Dysuria  Diarrhea, unspecified type  Lower abdominal pain     Discharge Instructions      The analysis of urine revealed elevated levels of protein which is concerning for dehydration.  Please be sure you are drinking plenty of water and/or Gatorade, particularly since you are having episodes of diarrhea.  There is no concern for urinary tract infection.  The results of your STD screening done today will be made available within the next 48 to 72 hours, you should be contacted by phone with the results and if treatment is required that will be provided for you as well.  Thank you for visiting urgent care today.     ED Prescriptions   None    PDMP not reviewed this encounter.    Theadora Rama Scales, PA-C 08/09/21 1153

## 2021-08-10 LAB — URINE CULTURE: Culture: NO GROWTH

## 2021-08-11 LAB — CYTOLOGY, (ORAL, ANAL, URETHRAL) ANCILLARY ONLY
Chlamydia: NEGATIVE
Comment: NEGATIVE
Comment: NEGATIVE
Comment: NORMAL
Neisseria Gonorrhea: NEGATIVE
Trichomonas: NEGATIVE

## 2021-08-13 ENCOUNTER — Telehealth (HOSPITAL_COMMUNITY): Payer: Self-pay | Admitting: Emergency Medicine

## 2021-08-13 NOTE — Telephone Encounter (Signed)
Tried returning pts call from yesterday. Unable to LVM.

## 2021-11-24 ENCOUNTER — Emergency Department (HOSPITAL_BASED_OUTPATIENT_CLINIC_OR_DEPARTMENT_OTHER)
Admission: EM | Admit: 2021-11-24 | Discharge: 2021-11-24 | Discharge status: Against Medical Advice | Payer: PRIVATE HEALTH INSURANCE

## 2021-11-24 ENCOUNTER — Other Ambulatory Visit: Payer: Self-pay

## 2021-11-24 NOTE — ED Notes (Signed)
Patient called x3.  Searched surrounding area.  No sign of patient.

## 2021-11-24 NOTE — ED Notes (Signed)
Patient called x2.  Searched surrounding area.  No sign of patient.

## 2021-11-24 NOTE — ED Notes (Signed)
Patient called x1.  Searched surrounding area.  No sign of patient.

## 2021-12-02 ENCOUNTER — Emergency Department (HOSPITAL_BASED_OUTPATIENT_CLINIC_OR_DEPARTMENT_OTHER)
Admission: EM | Admit: 2021-12-02 | Discharge: 2021-12-02 | Disposition: A | Discharge status: Home / Self Care | Payer: PRIVATE HEALTH INSURANCE | Attending: Emergency Medicine | Admitting: Emergency Medicine

## 2021-12-02 ENCOUNTER — Other Ambulatory Visit (HOSPITAL_BASED_OUTPATIENT_CLINIC_OR_DEPARTMENT_OTHER): Payer: Self-pay

## 2021-12-02 ENCOUNTER — Encounter (HOSPITAL_BASED_OUTPATIENT_CLINIC_OR_DEPARTMENT_OTHER): Payer: Self-pay | Admitting: *Deleted

## 2021-12-02 ENCOUNTER — Other Ambulatory Visit: Payer: Self-pay

## 2021-12-02 DIAGNOSIS — M791 Myalgia, unspecified site: Secondary | ICD-10-CM | POA: Insufficient documentation

## 2021-12-02 DIAGNOSIS — J4540 Moderate persistent asthma, uncomplicated: Secondary | ICD-10-CM | POA: Insufficient documentation

## 2021-12-02 DIAGNOSIS — Z7951 Long term (current) use of inhaled steroids: Secondary | ICD-10-CM | POA: Insufficient documentation

## 2021-12-02 DIAGNOSIS — R059 Cough, unspecified: Secondary | ICD-10-CM | POA: Diagnosis present

## 2021-12-02 DIAGNOSIS — Z20822 Contact with and (suspected) exposure to covid-19: Secondary | ICD-10-CM | POA: Insufficient documentation

## 2021-12-02 DIAGNOSIS — R0602 Shortness of breath: Secondary | ICD-10-CM | POA: Insufficient documentation

## 2021-12-02 DIAGNOSIS — R0989 Other specified symptoms and signs involving the circulatory and respiratory systems: Secondary | ICD-10-CM | POA: Diagnosis not present

## 2021-12-02 DIAGNOSIS — J4541 Moderate persistent asthma with (acute) exacerbation: Secondary | ICD-10-CM

## 2021-12-02 DIAGNOSIS — R051 Acute cough: Secondary | ICD-10-CM

## 2021-12-02 LAB — RESP PANEL BY RT-PCR (FLU A&B, COVID) ARPGX2
Influenza A by PCR: NEGATIVE
Influenza B by PCR: NEGATIVE
SARS Coronavirus 2 by RT PCR: NEGATIVE

## 2021-12-02 MED ORDER — DEXAMETHASONE 4 MG PO TABS
6.0000 mg | ORAL_TABLET | Freq: Once | ORAL | Status: AC
  Administered 2021-12-02: 6 mg via ORAL
  Filled 2021-12-02: qty 2

## 2021-12-02 MED ORDER — BUDESONIDE-FORMOTEROL FUMARATE 80-4.5 MCG/ACT IN AERO: 2.0000 | INHALATION_SPRAY | Freq: Two times a day (BID) | RESPIRATORY_TRACT | 2 refills | Status: DC

## 2021-12-02 MED ORDER — IPRATROPIUM-ALBUTEROL 0.5-2.5 (3) MG/3ML IN SOLN
3.0000 mL | RESPIRATORY_TRACT | Status: DC | PRN
  Administered 2021-12-02: 3 mL via RESPIRATORY_TRACT
  Filled 2021-12-02: qty 3

## 2021-12-02 MED ORDER — BUDESONIDE-FORMOTEROL FUMARATE 160-4.5 MCG/ACT IN AERO
2.0000 | INHALATION_SPRAY | Freq: Two times a day (BID) | RESPIRATORY_TRACT | 2 refills | Status: DC
  Filled 2021-12-02: qty 10.2, 30d supply, fill #0

## 2021-12-02 MED ORDER — PREDNISONE 10 MG PO TABS: 20.0000 mg | ORAL_TABLET | Freq: Every day | ORAL | 0 refills | Status: DC

## 2021-12-02 NOTE — ED Notes (Addendum)
Pt discharged to home. Discharge instructions have been discussed with patient and/or family members. Pt verbally acknowledges understanding d/c instructions, and endorses comprehension to checkout at registration before leaving. Pt was in lobby, stated he needed to leave. Unable to obtain d/c vitals.

## 2021-12-02 NOTE — Discharge Instructions (Addendum)
Call the [facility] to establish care with a primary provider. Previously you were on Symbicort.  This is a daily maintenance inhaler.  Use this twice in the morning and twice in the evening every day.  Only use the albuterol inhaler for wheezing as needed.  Try to rely on the Symbicort.  Y ou were given a steroid that will stay in your system for 3 days today.  In 3 days and now if you are still having wheezing you pick up the prednisone Dosepak.

## 2021-12-02 NOTE — ED Provider Notes (Signed)
[facility] EMERGENCY DEPARTMENT Provider Note   CSN: [id number] Arrival date & time: 12/02/21  1140     History  Chief Complaint  Patient presents with   Cough    Richard Lester is a 26 y.o. male.   Cough  Patient with history of moderate persistent asthma presents due to cough, myalgias, chest congestion x2 days.  He has pain in his chest when he coughs, does not radiate and not at rest.  Having diffuse body aches as well as feeling short of breath and wheezing.  States he just recently got a refill of his albuterol inhaler in the last few days.  He uses the albuterol rescue inhaler at least once daily and has for the last year.  Denies being on any intranasal steroid.  Has not been on steroids recently.  Denies any cardiac history.  Home Medications Prior to Admission medications   Medication Sig Start Date End Date Taking? Authorizing Provider  albuterol (VENTOLIN HFA) 108 (90 Base) MCG/ACT inhaler Inhale 1-2 puffs into the lungs every 6 (six) hours as needed for wheezing or shortness of breath. 06/22/19   Merrilee Jansky, MD  Albuterol Sulfate (PROAIR RESPICLICK) 108 (90 Base) MCG/ACT AEPB Inhale 1-2 puffs into the lungs every 4 (four) hours as needed (wheezing). 10/16/20   Georgetta Haber, NP  budesonide-formoterol (SYMBICORT) 80-4.5 MCG/ACT inhaler Inhale into the lungs 2 (two) times daily.    [provider]  dicyclomine (BENTYL) 20 MG tablet Take 1 tablet (20 mg total) by mouth 2 (two) times daily. 02/06/19   Gerhard Munch, MD      Allergies    Patient has no known allergies.    Review of Systems   Review of Systems  Respiratory:  Positive for cough.    Physical Exam Updated Vital Signs BP 119/81 (BP Location: Right Arm)    Pulse 86    Temp 98.4 F (36.9 C) (Oral)    Resp 18    Ht 5\' 11"  (1.803 m)    Wt 77.1 kg    SpO2 99%    BMI 23.71 kg/m  Physical Exam Vitals and nursing note reviewed. Exam conducted with a chaperone present.   Constitutional:      General: He is not in acute distress.    Appearance: Normal appearance.  HENT:     Head: Normocephalic and atraumatic.  Eyes:     General: No scleral icterus.    Extraocular Movements: Extraocular movements intact.     Pupils: Pupils are equal, round, and reactive to light.  Cardiovascular:     Rate and Rhythm: Normal rate and regular rhythm.  Pulmonary:     Effort: Pulmonary effort is normal.     Breath sounds: Wheezing present.     Comments: Speaking complete sentences, left and right upper lobe wheezing with expiration. Skin:    General: Skin is warm.     Coloration: Skin is not jaundiced.  Neurological:     Mental Status: He is alert. Mental status is at baseline.     Coordination: Coordination normal.    ED Results / Procedures / Treatments   Labs (all labs ordered are listed, but only abnormal results are displayed) Labs Reviewed  RESP PANEL BY RT-PCR (FLU A&B, COVID) [id number]    EKG None  Radiology No results found.  Procedures Procedures    Medications Ordered in ED Medications  ipratropium-albuterol (DUONEB) 0.5-2.5 (3) MG/3ML nebulizer solution 3 mL (3 mLs Nebulization Given 12/02/21 1217)  dexamethasone (  DECADRON) tablet 6 mg (6 mg Oral Given 12/02/21 1526)    ED Course/ Medical Decision Making/ A&P                           Medical Decision Making Risk Prescription drug management.   Patient is a 26 year old male with history of asthma presenting due to cough.  Differential diagnosis includes but is not limited to asthma exacerbation, pneumonia, pneumothorax, viral URI.  Patient's presentation is slightly complicated by comorbidity of poorly controlled asthma.  I reviewed patient's chart, he does not have a primary care or any outpatient management of his asthma.  His refilled his Symbicort and albuterol have been completed by ED urgent care providers for the last few years.  I reviewed patient's viral panel, it is negative for  COVID and flu.  Its been 2 days of symptoms so I think a bacterial pneumonia is unlikely.  There is some wheezing to his upper lobes consistent with an asthma exacerbation, his heart rate is regular and his pulses are 2+ bilaterally.  There is no tachycardia or hypoxia.  Symptoms appear consistent with viral URI and poorly controlled asthma.  I ordered patient Decadron and duoneb for wheezing.  Will prescribe Dosepak to take if symptoms are persistent over the next 3 days.    On reevaluation patient lung sounds have improved.    I reviewed patient's medication list, he was previously on Symbicort has not been on it for some time.  He is not very well-informed regarding care of his chronic asthma.  Given he has been using his albuterol inhaler multiple times daily for over a year and he has not had very consistent follow-up I will restart him on Symbicort in addition to the albuterol inhaler.  Considered additional work-up and evaluation but given patient is well-appearing and his wheezing has improved I feel he is stable for discharge.  I did put in a referral for primary care F/U.         Final Clinical Impression(s) / ED Diagnoses Final diagnoses:  None    Rx / DC Orders ED Discharge Orders     None         Theron Arista, PA-C 12/02/21 2304    Rolan Bucco, MD 12/02/21 2324

## 2021-12-02 NOTE — ED Triage Notes (Signed)
Cough with chest soreness,  body aches x 2 days.

## 2022-02-06 ENCOUNTER — Emergency Department (HOSPITAL_BASED_OUTPATIENT_CLINIC_OR_DEPARTMENT_OTHER)
Admission: EM | Admit: 2022-02-06 | Discharge: 2022-02-06 | Disposition: A | Discharge status: Home / Self Care | Payer: Self-pay | Attending: Emergency Medicine | Admitting: Emergency Medicine

## 2022-02-06 ENCOUNTER — Encounter (HOSPITAL_BASED_OUTPATIENT_CLINIC_OR_DEPARTMENT_OTHER): Payer: Self-pay

## 2022-02-06 ENCOUNTER — Emergency Department (HOSPITAL_BASED_OUTPATIENT_CLINIC_OR_DEPARTMENT_OTHER): Payer: Self-pay

## 2022-02-06 ENCOUNTER — Other Ambulatory Visit: Payer: Self-pay

## 2022-02-06 DIAGNOSIS — Z7952 Long term (current) use of systemic steroids: Secondary | ICD-10-CM | POA: Insufficient documentation

## 2022-02-06 DIAGNOSIS — J4521 Mild intermittent asthma with (acute) exacerbation: Secondary | ICD-10-CM | POA: Insufficient documentation

## 2022-02-06 DIAGNOSIS — Z7951 Long term (current) use of inhaled steroids: Secondary | ICD-10-CM | POA: Insufficient documentation

## 2022-02-06 DIAGNOSIS — R059 Cough, unspecified: Secondary | ICD-10-CM | POA: Diagnosis present

## 2022-02-06 MED ORDER — DEXAMETHASONE SODIUM PHOSPHATE 10 MG/ML IJ SOLN
10.0000 mg | Freq: Once | INTRAMUSCULAR | Status: AC
  Administered 2022-02-06: 10 mg
  Filled 2022-02-06: qty 1

## 2022-02-06 MED ORDER — ALBUTEROL SULFATE HFA 108 (90 BASE) MCG/ACT IN AERS
1.0000 | INHALATION_SPRAY | Freq: Four times a day (QID) | RESPIRATORY_TRACT | Status: DC
  Administered 2022-02-06: 1 via RESPIRATORY_TRACT
  Filled 2022-02-06: qty 6.7

## 2022-02-06 MED ORDER — ALBUTEROL SULFATE HFA 108 (90 BASE) MCG/ACT IN AERS: 1.0000 | INHALATION_SPRAY | Freq: Four times a day (QID) | RESPIRATORY_TRACT | 1 refills | Status: DC | PRN

## 2022-02-06 MED ORDER — IPRATROPIUM-ALBUTEROL 0.5-2.5 (3) MG/3ML IN SOLN
3.0000 mL | Freq: Once | RESPIRATORY_TRACT | Status: AC
  Administered 2022-02-06: 3 mL via RESPIRATORY_TRACT
  Filled 2022-02-06: qty 3

## 2022-02-06 NOTE — ED Triage Notes (Signed)
Pt reports problems related to asthma. Worse at night. Coughing and chest tightness. Pt used newborns neb at 3 am without relief ?

## 2022-02-06 NOTE — ED Provider Notes (Signed)
?[facility] EMERGENCY DEPARTMENT ?Provider Note ? ? ?CSN: [id number] ?Arrival date & time: 02/06/22  [id number] ? ?  ? ?History ? ?Chief Complaint  ?Patient presents with  ? Cough  ? ? ?Richard Lester is a 26 y.o. male. ? ? Patient as above with significant medical history as below, including asthma who presents to the ED with complaint of cough, asthma symptoms ? ?Patient reports progressive worsening breathing, especially at nighttime over the past 2 weeks.  He has run out of his inhaler secondary to insurance issues.  Reports has been using his daughter's inhaler past couple days with mild improvement.  No fevers, chills, chest pain.  Does report some chest tightness feels like it is hard to take a deep breath she typically does feel with his asthma in the past.  Cough is nonproductive.  No nausea or vomiting.  No fevers or chills.  He is on p.o. intake without difficulty.  No bowel or bladder dysfunction.  No rashes.  No recent travel or sick contacts. ? ? ?Past Medical History: ?No date: Asthma ? ?History reviewed. No pertinent surgical history.  ? ? ?The history is provided by the patient. No language interpreter was used.  ?Cough ?Associated symptoms: no chest pain, no chills, no fever, no headaches, no rash and no shortness of breath   ? ?  ? ?Home Medications ?Prior to Admission medications   ?Medication Sig Start Date End Date Taking? Authorizing Provider  ?albuterol (VENTOLIN HFA) 108 (90 Base) MCG/ACT inhaler Inhale 1-2 puffs into the lungs every 6 (six) hours as needed for wheezing or shortness of breath. 02/06/22  Yes Tanda RockersGray, Yadhira Mckneely A, DO  ?albuterol (VENTOLIN HFA) 108 (90 Base) MCG/ACT inhaler Inhale 1-2 puffs into the lungs every 6 (six) hours as needed for wheezing or shortness of breath. 06/22/19   LampteyBritta Mccreedy, Philip O, MD  ?Albuterol Sulfate (PROAIR RESPICLICK) 108 (90 Base) MCG/ACT AEPB Inhale 1-2 puffs into the lungs every 4 (four) hours as needed (wheezing). 10/16/20   Georgetta HaberBurky, Natalie B, NP   ?budesonide-formoterol (SYMBICORT) 80-4.5 MCG/ACT inhaler Inhale 2 puffs into the lungs 2 (two) times daily. 12/02/21   Theron AristaSage, Haley, PA-C  ?dicyclomine (BENTYL) 20 MG tablet Take 1 tablet (20 mg total) by mouth 2 (two) times daily. 02/06/19   Gerhard MunchLockwood, Robert, MD  ?predniSONE (DELTASONE) 10 MG tablet Take 2 tablets (20 mg total) by mouth daily. 12/02/21   Theron AristaSage, Haley, PA-C  ?   ? ?Allergies    ?Patient has no known allergies.   ? ?Review of Systems   ?Review of Systems  ?Constitutional:  Negative for chills and fever.  ?HENT:  Negative for facial swelling and trouble swallowing.   ?Eyes:  Negative for photophobia and visual disturbance.  ?Respiratory:  Positive for cough and chest tightness. Negative for shortness of breath.   ?Cardiovascular:  Negative for chest pain and palpitations.  ?Gastrointestinal:  Negative for abdominal pain, nausea and vomiting.  ?Endocrine: Negative for polydipsia and polyuria.  ?Genitourinary:  Negative for difficulty urinating and hematuria.  ?Musculoskeletal:  Negative for gait problem and joint swelling.  ?Skin:  Negative for pallor and rash.  ?Neurological:  Negative for syncope and headaches.  ?Psychiatric/Behavioral:  Negative for agitation and confusion.   ? ?Physical Exam ?Updated Vital Signs ?BP 124/71   Pulse 62   Temp 98.1 ?F (36.7 ?C)   Resp 18   Ht 5\' 11"  (1.803 m)   Wt 77.1 kg   SpO2 95%   BMI 23.71 kg/m?  ?  Physical Exam ?Vitals and nursing note reviewed.  ?Constitutional:   ?   General: He is not in acute distress. ?   Appearance: He is well-developed.  ?HENT:  ?   Head: Normocephalic and atraumatic.  ?   Right Ear: External ear normal.  ?   Left Ear: External ear normal.  ?   Mouth/Throat:  ?   Mouth: Mucous membranes are moist.  ?Eyes:  ?   General: No scleral icterus. ?Cardiovascular:  ?   Rate and Rhythm: Normal rate and regular rhythm.  ?   Pulses: Normal pulses.  ?   Heart sounds: Normal heart sounds.  ?Pulmonary:  ?   Effort: Pulmonary effort is normal. No  tachypnea, accessory muscle usage or respiratory distress.  ?   Breath sounds: Normal breath sounds. Decreased air movement present. No wheezing.  ?Abdominal:  ?   General: Abdomen is flat.  ?   Palpations: Abdomen is soft.  ?   Tenderness: There is no abdominal tenderness.  ?Musculoskeletal:     ?   General: Normal range of motion.  ?   Cervical back: Normal range of motion.  ?   Right lower leg: No edema.  ?   Left lower leg: No edema.  ?Skin: ?   General: Skin is warm and dry.  ?   Capillary Refill: Capillary refill takes less than 2 seconds.  ?Neurological:  ?   Mental Status: He is alert and oriented to person, place, and time.  ?Psychiatric:     ?   Mood and Affect: Mood normal.     ?   Behavior: Behavior normal.  ? ? ?ED Results / Procedures / Treatments   ?Labs ?(all labs ordered are listed, but only abnormal results are displayed) ?Labs Reviewed - No data to display ? ? ?EKG ?None ? ?Radiology ?DG Chest 1 View ? ?Result Date: 02/06/2022 ?CLINICAL DATA:  Cough. EXAM: CHEST  1 VIEW COMPARISON:  06/22/2019 FINDINGS: Unchanged cardiac silhouette and mediastinal contours given AP projection slightly reduced lung volumes. No discrete focal airspace opacities. No pleural effusion or pneumothorax no evidence of edema. No acute osseous abnormalities. IMPRESSION: No acute cardiopulmonary disease on this AP portable examination. Further evaluation with a PA and lateral chest radiograph may be obtained as clinically indicated. Electronically Signed   By: Simonne Come M.D.   On: 02/06/2022 08:19   ? ?Procedures ?Procedures  ? ? ?Medications Ordered in ED ?Medications  ?albuterol (VENTOLIN HFA) 108 (90 Base) MCG/ACT inhaler 1-2 puff (1 puff Inhalation Given 02/06/22 0808)  ?ipratropium-albuterol (DUONEB) 0.5-2.5 (3) MG/3ML nebulizer solution 3 mL (3 mLs Nebulization Given 02/06/22 0808)  ?dexamethasone (DECADRON) injection 10 mg (10 mg Other Given 02/06/22 0815)  ? ? ?ED Course/ Medical Decision Making/ A&P ?  ?                         ?Medical Decision Making ?Amount and/or Complexity of Data Reviewed ?Radiology: ordered. ? ?Risk ?Prescription drug management. ? ? ? ?CC: Asthma, chest tightness ? ?This patient presents to the Emergency Department for the above complaint. This involves an extensive number of treatment options and is a complaint that carries with it a high risk of complications and morbidity. Vital signs were reviewed. Serious etiologies considered. ?Differential includes but is not limited to MSK, asthma exacerbation, pneumonia, viral syndrome, ptx ? ?Record review:  ?Previous records obtained and reviewed-prior ED visits, prior office visits, prior urgent care visits. ? ?Additional  history obtained from n/a ? ?Medical and surgical history as noted above.  ? ?Work up as above, notable for:  ? ?Labs & imaging results that were available during my care of the patient were visualized by me and considered in my medical decision making. ?  ?I ordered imaging studies which included chest x-ray and I visualized the imaging and I agree with radiologist interpretation. No acute process ? ?Management: ?Give DuoNeb, Decadron ? ?Reassessment:  ?Pt is feeling better, favor mild asthma exacerbation. He has poorly controlled asthma. Will give inhaler for home, he received decadron in the ED> advised him to avoid triggers of his asthma, use a humidifier in home.  ? ?Breathing comfortably on ambient air, speaking clearly and in full sentences.  ? ? ?The patient improved significantly and was discharged in stable condition. Detailed discussions were had with the patient regarding current findings, and need for close f/u with PCP or on call doctor. The patient has been instructed to return immediately if the symptoms worsen in any way for re-evaluation. Patient verbalized understanding and is in agreement with current care plan. All questions answered prior to discharge. ? ? ? ? ? ? ? ? ? ? ? ? ? ? ? ? ? ?Social determinants of health  include -  ?Social History  ? ?Socioeconomic History  ? Marital status: Single  ?  Spouse name: Not on file  ? Number of children: Not on file  ? Years of education: Not on file  ? Highest education level: Not on file  ?Oc

## 2022-02-06 NOTE — Discharge Instructions (Addendum)
It was a pleasure caring for you today in the emergency department. ? ?Please return to the emergency department for any worsening or worrisome symptoms. ? ?Use humidifier in your home  ? ?

## 2022-02-19 ENCOUNTER — Other Ambulatory Visit: Payer: Self-pay

## 2022-02-19 ENCOUNTER — Encounter (HOSPITAL_BASED_OUTPATIENT_CLINIC_OR_DEPARTMENT_OTHER): Payer: Self-pay | Admitting: Urology

## 2022-02-19 ENCOUNTER — Emergency Department (HOSPITAL_BASED_OUTPATIENT_CLINIC_OR_DEPARTMENT_OTHER)
Admission: EM | Admit: 2022-02-19 | Discharge: 2022-02-19 | Disposition: A | Discharge status: Home / Self Care | Payer: Self-pay | Attending: Emergency Medicine | Admitting: Emergency Medicine

## 2022-02-19 ENCOUNTER — Emergency Department (HOSPITAL_BASED_OUTPATIENT_CLINIC_OR_DEPARTMENT_OTHER): Payer: Self-pay

## 2022-02-19 DIAGNOSIS — Z7951 Long term (current) use of inhaled steroids: Secondary | ICD-10-CM | POA: Insufficient documentation

## 2022-02-19 DIAGNOSIS — R519 Headache, unspecified: Secondary | ICD-10-CM | POA: Insufficient documentation

## 2022-02-19 DIAGNOSIS — J45909 Unspecified asthma, uncomplicated: Secondary | ICD-10-CM | POA: Insufficient documentation

## 2022-02-19 LAB — CBC WITH DIFFERENTIAL/PLATELET
Abs Immature Granulocytes: 0.02 10*3/uL (ref 0.00–0.07)
Basophils Absolute: 0.1 10*3/uL (ref 0.0–0.1)
Basophils Relative: 1 %
Eosinophils Absolute: 0.4 10*3/uL (ref 0.0–0.5)
Eosinophils Relative: 6 %
HCT: 43.4 % (ref 39.0–52.0)
Hemoglobin: 15.2 g/dL (ref 13.0–17.0)
Immature Granulocytes: 0 %
Lymphocytes Relative: 29 %
Lymphs Abs: 1.6 10*3/uL (ref 0.7–4.0)
MCH: 30.8 pg (ref 26.0–34.0)
MCHC: 35 g/dL (ref 30.0–36.0)
MCV: 88 fL (ref 80.0–100.0)
Monocytes Absolute: 0.3 10*3/uL (ref 0.1–1.0)
Monocytes Relative: 5 %
Neutro Abs: 3.4 10*3/uL (ref 1.7–7.7)
Neutrophils Relative %: 59 %
Platelets: 260 10*3/uL (ref 150–400)
RBC: 4.93 MIL/uL (ref 4.22–5.81)
RDW: 11.9 % (ref 11.5–15.5)
WBC: 5.7 10*3/uL (ref 4.0–10.5)
nRBC: 0 % (ref 0.0–0.2)

## 2022-02-19 LAB — COMPREHENSIVE METABOLIC PANEL
ALT: 17 U/L (ref 0–44)
AST: 20 U/L (ref 15–41)
Albumin: 4.6 g/dL (ref 3.5–5.0)
Alkaline Phosphatase: 60 U/L (ref 38–126)
Anion gap: 8 (ref 5–15)
BUN: 12 mg/dL (ref 6–20)
CO2: 25 mmol/L (ref 22–32)
Calcium: 9.5 mg/dL (ref 8.9–10.3)
Chloride: 103 mmol/L (ref 98–111)
Creatinine, Ser: 1.13 mg/dL (ref 0.61–1.24)
GFR, Estimated: >60 mL/min (ref >60)
Glucose, Bld: 94 mg/dL (ref 70–99)
Potassium: 3.5 mmol/L (ref 3.5–5.1)
Sodium: 136 mmol/L (ref 135–145)
Total Bilirubin: 1 mg/dL (ref 0.3–1.2)
Total Protein: 8.1 g/dL (ref 6.5–8.1)

## 2022-02-19 NOTE — Discharge Instructions (Signed)
Take tylenol or ibuprofen as needed for pain. °

## 2022-02-19 NOTE — ED Provider Notes (Signed)
?[facility] EMERGENCY DEPARTMENT ?Provider Note ? ? ?CSN: [id number] ?Arrival date & time: 02/19/22  1306 ? ?  ? ?History ? ?Chief Complaint  ?Patient presents with  ? Headache  ? ? ?Richard Lester is a 26 y.o. male. ? ?Pt is 26 yo male with pmhx significant for asthma.  Pt said he's been having intermittent headaches for the past several days.  He denies n/v, light-sensitivity, or fevers.  He said he has no vision problems.  Screens do not bother him.  He did not look up from his game on his phone during out whole interview.  He said he's been going through stress, but will not say what.  He said he does not have a headache now. ? ? ?  ? ?Home Medications ?Prior to Admission medications   ?Medication Sig Start Date End Date Taking? Authorizing Provider  ?albuterol (VENTOLIN HFA) 108 (90 Base) MCG/ACT inhaler Inhale 1-2 puffs into the lungs every 6 (six) hours as needed for wheezing or shortness of breath. 06/22/19   Lamptey, Britta Mccreedy, MD  ?albuterol (VENTOLIN HFA) 108 (90 Base) MCG/ACT inhaler Inhale 1-2 puffs into the lungs every 6 (six) hours as needed for wheezing or shortness of breath. 02/06/22   Sloan Leiter, DO  ?Albuterol Sulfate (PROAIR RESPICLICK) 108 (90 Base) MCG/ACT AEPB Inhale 1-2 puffs into the lungs every 4 (four) hours as needed (wheezing). 10/16/20   Georgetta Haber, NP  ?budesonide-formoterol (SYMBICORT) 80-4.5 MCG/ACT inhaler Inhale 2 puffs into the lungs 2 (two) times daily. 12/02/21   Theron Arista, PA-C  ?dicyclomine (BENTYL) 20 MG tablet Take 1 tablet (20 mg total) by mouth 2 (two) times daily. 02/06/19   Gerhard Munch, MD  ?predniSONE (DELTASONE) 10 MG tablet Take 2 tablets (20 mg total) by mouth daily. 12/02/21   Theron Arista, PA-C  ?   ? ?Allergies    ?Patient has no known allergies.   ? ?Review of Systems   ?Review of Systems  ?Neurological:  Positive for headaches.  ?All other systems reviewed and are negative. ? ?Physical Exam ?Updated Vital Signs ?BP 126/71 (BP Location: Right  Arm)   Pulse 65   Temp 98.2 ?F (36.8 ?C) (Oral)   Resp 20   Ht 5\' 11"  (1.803 m)   Wt 77.1 kg   SpO2 100%   BMI 23.71 kg/m?  ?Physical Exam ?Vitals and nursing note reviewed.  ?Constitutional:   ?   Appearance: He is well-developed.  ?HENT:  ?   Head: Normocephalic and atraumatic.  ?   Mouth/Throat:  ?   Mouth: Mucous membranes are moist.  ?   Pharynx: Oropharynx is clear.  ?Eyes:  ?   Extraocular Movements: Extraocular movements intact.  ?   Pupils: Pupils are equal, round, and reactive to light.  ?Cardiovascular:  ?   Rate and Rhythm: Normal rate and regular rhythm.  ?   Heart sounds: Normal heart sounds.  ?Pulmonary:  ?   Effort: Pulmonary effort is normal.  ?   Breath sounds: Normal breath sounds.  ?Abdominal:  ?   General: Bowel sounds are normal.  ?   Palpations: Abdomen is soft.  ?Musculoskeletal:     ?   General: Normal range of motion.  ?   Cervical back: Normal range of motion and neck supple.  ?Skin: ?   General: Skin is warm and dry.  ?Neurological:  ?   Mental Status: He is alert and oriented to person, place, and time.  ?Psychiatric:     ?  Mood and Affect: Mood normal.     ?   Behavior: Behavior normal.  ? ? ?ED Results / Procedures / Treatments   ?Labs ?(all labs ordered are listed, but only abnormal results are displayed) ?Labs Reviewed  ?COMPREHENSIVE METABOLIC PANEL  ?CBC WITH DIFFERENTIAL/PLATELET  ? ? ?EKG ?None ? ?Radiology ?CT Head Wo Contrast ? ?Result Date: 02/19/2022 ?CLINICAL DATA:  Headache EXAM: CT HEAD WITHOUT CONTRAST TECHNIQUE: Contiguous axial images were obtained from the base of the skull through the vertex without intravenous contrast. RADIATION DOSE REDUCTION: This exam was performed according to the departmental dose-optimization program which includes automated exposure control, adjustment of the mA and/or kV according to patient size and/or use of iterative reconstruction technique. COMPARISON:  None Available. FINDINGS: Somewhat limited due to motion and bone artifacts.  Brain: No acute intracranial hemorrhage, mass effect, or herniation. No extra-axial fluid collections. No evidence of acute territorial infarct. No hydrocephalus. Vascular: No hyperdense vessel or unexpected calcification. Skull: Normal. Negative for fracture or focal lesion. Sinuses/Orbits: No acute finding. Other: None. IMPRESSION: No acute intracranial process identified. Electronically Signed   By: Jannifer Hick M.D.   On: 02/19/2022 15:01   ? ?Procedures ?Procedures  ? ? ?Medications Ordered in ED ?Medications - No data to display ? ?ED Course/ Medical Decision Making/ A&P ?  ?                        ?Medical Decision Making ?Amount and/or Complexity of Data Reviewed ?Labs: ordered. ?Radiology: ordered. ? ? ?This patient presents to the ED for concern of headache, this involves an extensive number of treatment options, and is a complaint that carries with it a high risk of complications and morbidity.  The differential diagnosis includes stress induced, migraine, intracranial abn ? ? ?Co morbidities that complicate the patient evaluation ? ?asthma ? ? ?Additional history obtained: ? ?Additional history obtained from epic chart review ? ? ? ?Lab Tests: ? ?I Ordered, and personally interpreted labs.  The pertinent results include:  cbc is nl ? ? ?Imaging Studies ordered: ? ?I ordered imaging studies including ct head  ?I independently visualized and interpreted imaging which showed  ?  ?IMPRESSION:  ?No acute intracranial process identified.  ?   ? ?I agree with the radiologist interpretation ? ? ?Cardiac Monitoring: ? ?The patient was maintained on a cardiac monitor.  I personally viewed and interpreted the cardiac monitored which showed an underlying rhythm of: nsr ? ? ?Problem List / ED Course: ? ?Headaches:  likely stress induced.  Pt's CT is nl and exam is nl.  Pt encouraged to try stress reduction techniques.  He is to establish care with pcp.  He is to return if worse. ? ? ?Reevaluation: ? ?After the  interventions noted above, I reevaluated the patient and found that they have :improved ? ? ?Social Determinants of Health: ? ?No insurance.  No pcp. ? ? ?Dispostion: ? ?After consideration of the diagnostic results and the patients response to treatment, I feel that the patent would benefit from discharge with outpatient f/u.   ? ? ? ? ? ? ? ?Final Clinical Impression(s) / ED Diagnoses ?Final diagnoses:  ?Acute nonintractable headache, unspecified headache type  ? ? ?Rx / DC Orders ?ED Discharge Orders   ? ? None  ? ?  ? ? ?  ?Jacalyn Lefevre, MD ?02/19/22 1531 ? ?

## 2022-02-19 NOTE — ED Triage Notes (Signed)
Intermittent headaches "on and off"  ?Denies N/V, denies sensitivity to light and sound ?States minimal pain at this time  ?Took tylenol pta  ? ?

## 2022-04-12 ENCOUNTER — Emergency Department (HOSPITAL_BASED_OUTPATIENT_CLINIC_OR_DEPARTMENT_OTHER): Payer: Self-pay

## 2022-04-12 ENCOUNTER — Encounter (HOSPITAL_BASED_OUTPATIENT_CLINIC_OR_DEPARTMENT_OTHER): Payer: Self-pay | Admitting: Emergency Medicine

## 2022-04-12 ENCOUNTER — Emergency Department (HOSPITAL_BASED_OUTPATIENT_CLINIC_OR_DEPARTMENT_OTHER)
Admission: EM | Admit: 2022-04-12 | Discharge: 2022-04-12 | Disposition: A | Discharge status: Home / Self Care | Payer: Self-pay | Attending: Emergency Medicine | Admitting: Emergency Medicine

## 2022-04-12 ENCOUNTER — Other Ambulatory Visit: Payer: Self-pay

## 2022-04-12 DIAGNOSIS — J680 Bronchitis and pneumonitis due to chemicals, gases, fumes and vapors: Secondary | ICD-10-CM | POA: Insufficient documentation

## 2022-04-12 DIAGNOSIS — Z7951 Long term (current) use of inhaled steroids: Secondary | ICD-10-CM | POA: Insufficient documentation

## 2022-04-12 DIAGNOSIS — Z7952 Long term (current) use of systemic steroids: Secondary | ICD-10-CM | POA: Insufficient documentation

## 2022-04-12 DIAGNOSIS — T6591XA Toxic effect of unspecified substance, accidental (unintentional), initial encounter: Secondary | ICD-10-CM | POA: Insufficient documentation

## 2022-04-12 DIAGNOSIS — J45909 Unspecified asthma, uncomplicated: Secondary | ICD-10-CM | POA: Insufficient documentation

## 2022-04-12 LAB — BASIC METABOLIC PANEL
Anion gap: 5 (ref 5–15)
BUN: 8 mg/dL (ref 6–20)
CO2: 29 mmol/L (ref 22–32)
Calcium: 9.4 mg/dL (ref 8.9–10.3)
Chloride: 103 mmol/L (ref 98–111)
Creatinine, Ser: 1.14 mg/dL (ref 0.61–1.24)
GFR, Estimated: >60 mL/min (ref >60)
Glucose, Bld: 91 mg/dL (ref 70–99)
Potassium: 3.6 mmol/L (ref 3.5–5.1)
Sodium: 137 mmol/L (ref 135–145)

## 2022-04-12 LAB — CBC
HCT: 43.4 % (ref 39.0–52.0)
Hemoglobin: 15 g/dL (ref 13.0–17.0)
MCH: 30.7 pg (ref 26.0–34.0)
MCHC: 34.6 g/dL (ref 30.0–36.0)
MCV: 88.9 fL (ref 80.0–100.0)
Platelets: 251 10*3/uL (ref 150–400)
RBC: 4.88 MIL/uL (ref 4.22–5.81)
RDW: 11.7 % (ref 11.5–15.5)
WBC: 7 10*3/uL (ref 4.0–10.5)
nRBC: 0 % (ref 0.0–0.2)

## 2022-04-12 LAB — TROPONIN I (HIGH SENSITIVITY): Troponin I (High Sensitivity): 3 ng/L (ref <18)

## 2022-04-12 MED ORDER — DEXAMETHASONE SODIUM PHOSPHATE 10 MG/ML IJ SOLN
10.0000 mg | Freq: Once | INTRAMUSCULAR | Status: AC
  Administered 2022-04-12: 10 mg via INTRAVENOUS
  Filled 2022-04-12: qty 1

## 2022-04-12 NOTE — ED Notes (Signed)
Troponin was canceled per dr Emi Holes

## 2022-04-12 NOTE — ED Triage Notes (Signed)
Pt arrives pov, steady gait, c/o mid and right side CP today. Endorses shob. Reports occurrence while cleaning mold today with baking soda, vinegar and chlorox

## 2022-04-12 NOTE — ED Provider Notes (Signed)
[facility] EMERGENCY DEPARTMENT Provider Note   CSN: [id number] Arrival date & time: 04/12/22  1533     History  Chief Complaint  Patient presents with   Chest Pain   Chemical Exposure    Richard Lester is a 26 y.o. male.  HPI     12PM mixed backing soda, vinegar, chlorox to clean About 5 minutes later developed shortness of breath Right sided and middle chest pain, no radiation. Felt like dull pain. Could still taste the chemicals Was wheezing, breathing treatment helped with wheezing but pain still there No nausea, vomiting, leg pain or swelling, diaphoresis Has had cough as well Symptoms started at 11, still tasting chemicals, still having pain, shortness of breath improved now  No smoking, other drugs, etoh No fam hx of early heart disease, blood clots No hx of dvt/pe, recent surgeries, immobilization   Home Medications Prior to Admission medications   Medication Sig Start Date End Date Taking? Authorizing Provider  albuterol (VENTOLIN HFA) 108 (90 Base) MCG/ACT inhaler Inhale 1-2 puffs into the lungs every 6 (six) hours as needed for wheezing or shortness of breath. 06/22/19   Lamptey, Britta Mccreedy, MD  albuterol (VENTOLIN HFA) 108 (90 Base) MCG/ACT inhaler Inhale 1-2 puffs into the lungs every 6 (six) hours as needed for wheezing or shortness of breath. 02/06/22   Sloan Leiter, DO  Albuterol Sulfate (PROAIR RESPICLICK) 108 (90 Base) MCG/ACT AEPB Inhale 1-2 puffs into the lungs every 4 (four) hours as needed (wheezing). 10/16/20   Georgetta Haber, NP  budesonide-formoterol (SYMBICORT) 80-4.5 MCG/ACT inhaler Inhale 2 puffs into the lungs 2 (two) times daily. 12/02/21   Theron Arista, PA-C  dicyclomine (BENTYL) 20 MG tablet Take 1 tablet (20 mg total) by mouth 2 (two) times daily. 02/06/19   Gerhard Munch, MD  predniSONE (DELTASONE) 10 MG tablet Take 2 tablets (20 mg total) by mouth daily. 12/02/21   Theron Arista, PA-C      Allergies    Patient has no known  allergies.    Review of Systems   Review of Systems  Physical Exam Updated Vital Signs BP 115/71   Pulse 68   Temp 98.3 F (36.8 C) (Oral)   Resp 20   Ht 6' (1.829 m)   Wt 77.1 kg   SpO2 96%   BMI 23.06 kg/m  Physical Exam Vitals and nursing note reviewed.  Constitutional:      General: He is not in acute distress.    Appearance: He is well-developed. He is not diaphoretic.  HENT:     Head: Normocephalic and atraumatic.  Eyes:     Conjunctiva/sclera: Conjunctivae normal.  Cardiovascular:     Rate and Rhythm: Normal rate and regular rhythm.     Heart sounds: Normal heart sounds. No murmur heard.    No friction rub. No gallop.  Pulmonary:     Effort: Pulmonary effort is normal. No respiratory distress.     Breath sounds: Normal breath sounds. No wheezing or rales.  Chest:     Chest wall: Tenderness present.  Abdominal:     General: There is no distension.     Palpations: Abdomen is soft.     Tenderness: There is no abdominal tenderness. There is no guarding.  Musculoskeletal:     Cervical back: Normal range of motion.  Skin:    General: Skin is warm and dry.  Neurological:     Mental Status: He is alert and oriented to person, place, and time.  ED Results / Procedures / Treatments   Labs (all labs ordered are listed, but only abnormal results are displayed) Labs Reviewed  BASIC METABOLIC PANEL  CBC  TROPONIN I (HIGH SENSITIVITY)  TROPONIN I (HIGH SENSITIVITY)    EKG EKG Interpretation  Date/Time:  Sunday April 12 2022 15:42:52 EDT Ventricular Rate:  69 PR Interval:  158 QRS Duration: 88 QT Interval:  346 QTC Calculation: 370 R Axis:   86 Text Interpretation: Normal sinus rhythm Septal infarct , age undetermined Abnormal ECG When compared with ECG of 22-Jun-2019 00:06, Mild elevation anteriorly, likely benign early repolarization Confirmed by Alvira Monday ([id number]) on 04/12/2022 6:46:54 PM  Radiology DG Chest 2 View  Result Date:  04/12/2022 CLINICAL DATA:  Mid and right side chest pain w/ shob today; was cleaning mold today with baking soda, vinegar and clorox. Non smoker. Asthmatic. EXAM: CHEST - 2 VIEW COMPARISON:  02/06/2022 FINDINGS: Normal heart, mediastinum and hila. Clear lungs.  No pleural effusion or pneumothorax. Skeletal structures are within normal limits. IMPRESSION: Normal chest radiographs. Electronically Signed   By: Amie Portland M.D.   On: 04/12/2022 16:16    Procedures Procedures    Medications Ordered in ED Medications  dexamethasone (DECADRON) injection 10 mg (10 mg Intravenous Given 04/12/22 1909)    ED Course/ Medical Decision Making/ A&P                           Medical Decision Making Amount and/or Complexity of Data Reviewed Labs: ordered. Radiology: ordered.  Risk Prescription drug management.   26yo male with history of asthma who presents with concern for cough, dyspnea, chest pain after using cleaning solution.  Differential diagnosis for chest pain includes pulmonary embolus, dissection, pneumothorax, pneumonia, ACS, myocarditis, pericarditis, irritant bronchitis/pneumonitis, asathma.  EKG was done and evaluate by me and showed mild ST changes, no signs of pericarditis. Chest x-ray was done and evaluated by me and radiology and showed no sign of pneumonia or pneumothorax. Patient is PERC negative and low risk Wells and have low suspicion for PE.  Patient troponins negative 4 hours after symptoms began and in setting of irritant and cough doubt ACS.  Do not feel history or exam are consistent with aortic dissection.   Received nebulizer and feels shortness of breath and wheezing has improved. Does not have wheezing on exam at this time.  Given decadron for suspected asthma secondary to chemical exposure.          Final Clinical Impression(s) / ED Diagnoses Final diagnoses:  Acute chemical bronchitis (HCC)  Uncomplicated asthma, unspecified asthma severity, unspecified whether  persistent    Rx / DC Orders ED Discharge Orders     None         Alvira Monday, MD 04/13/22 [phone]

## 2022-04-12 NOTE — ED Notes (Signed)
Patient states that he was having chest pain that was located on rt side of chest .Denies any shortness of breath

## 2022-09-16 IMAGING — CT CT HEAD W/O CM
3 series · 16 of 47 positions shown, 19 images · non-contrast
Comparison: None Available.

CLINICAL DATA: Headache



[Series 2: head wo · axial · 0.44mm/px · z∈[-206,-61]mm · 10 of 35 slices shown, 13 images]
[im 3/35  brain]
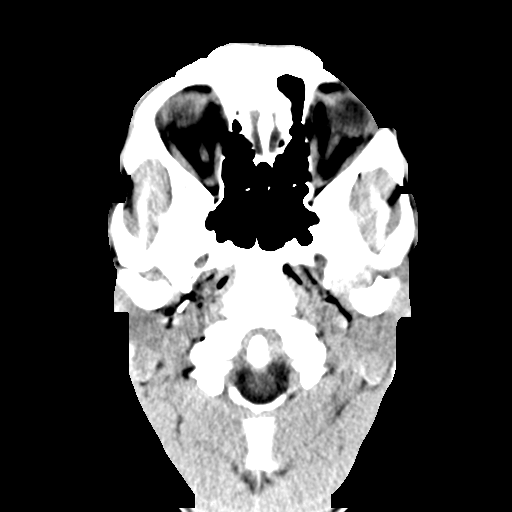
[im 3/35  bone]
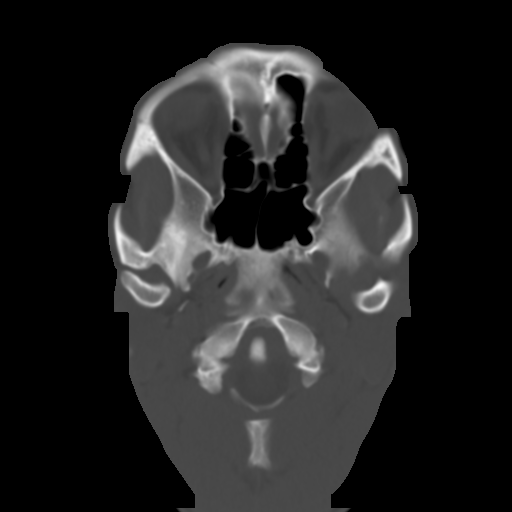
[im 6/35  brain]
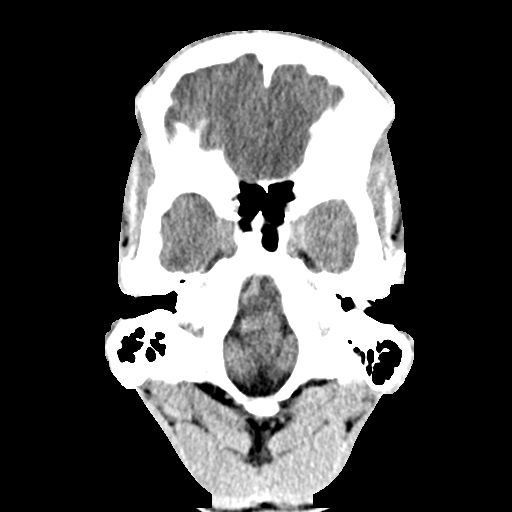
[im 10/35  brain]
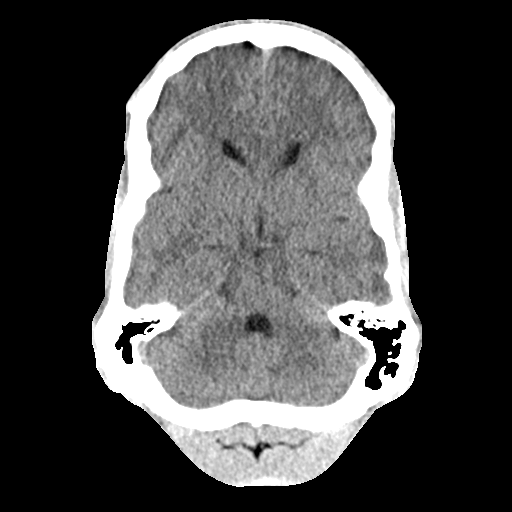
[im 12/35  brain]
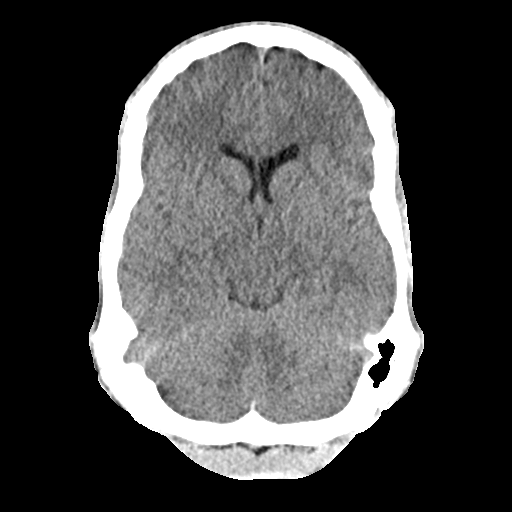
[im 16/35  brain]
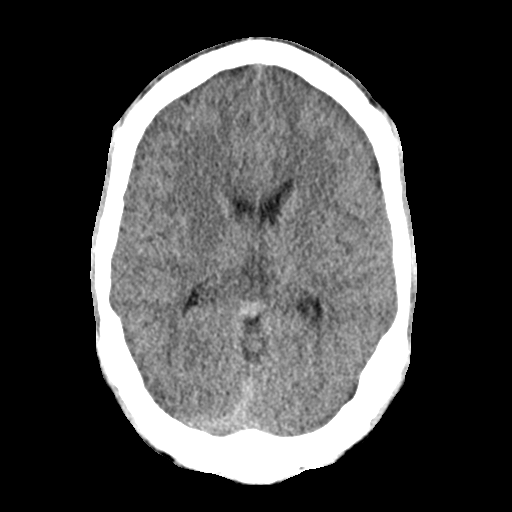
[im 16/35  bone]
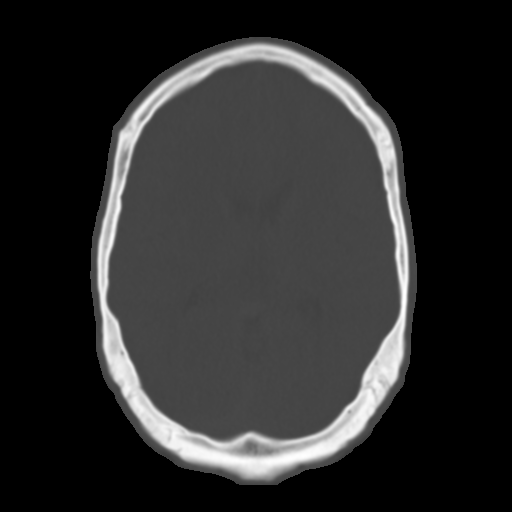
[im 19/35  brain]
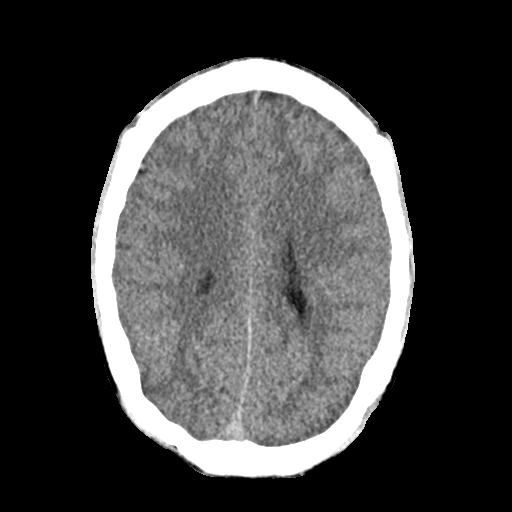
[im 23/35  brain]
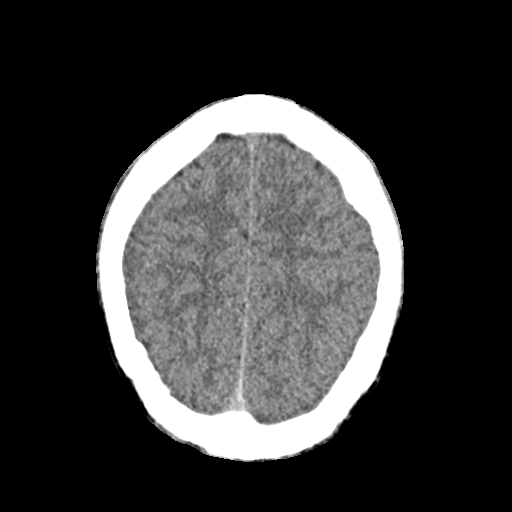
[im 26/35  brain]
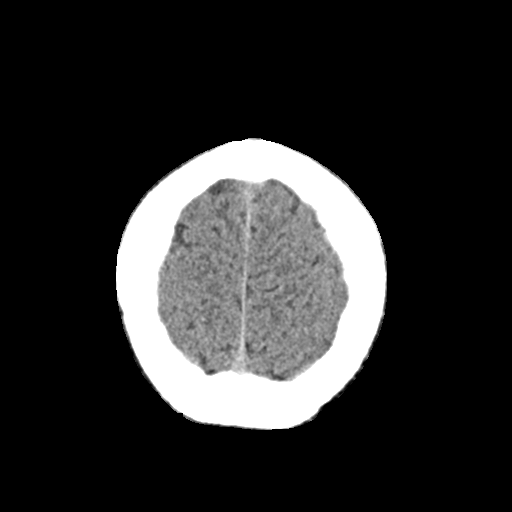
[im 29/35  brain]
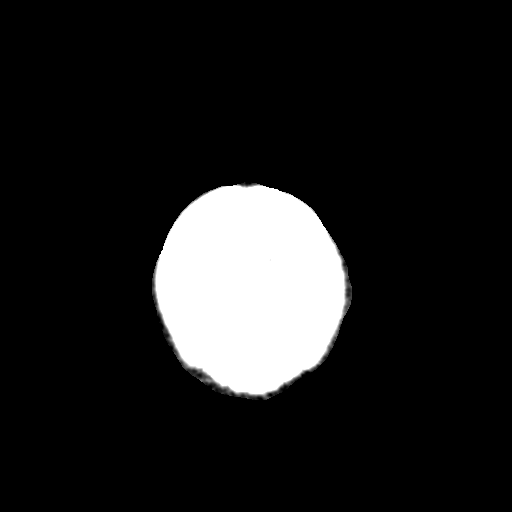
[im 29/35  bone]
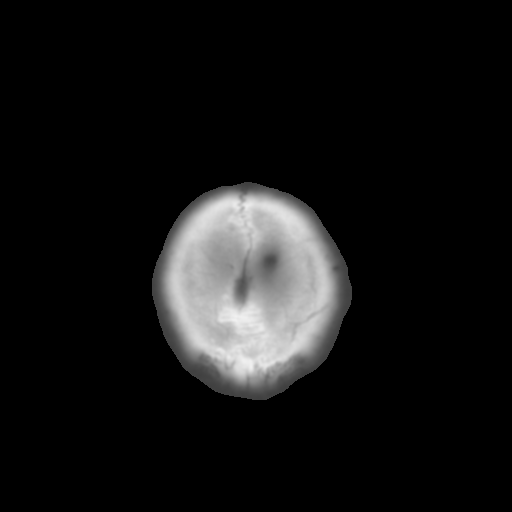
[im 32/35  brain]
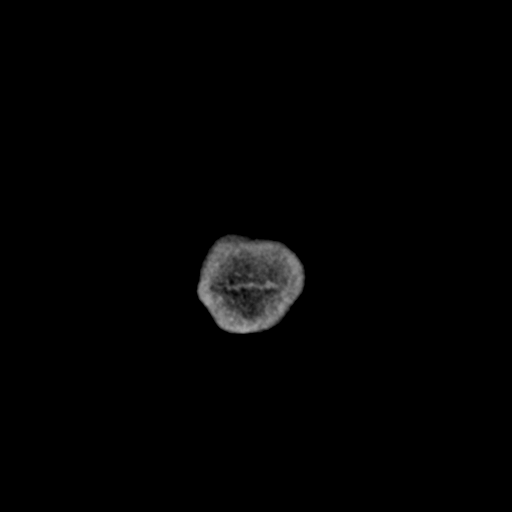

[Series 4: coronal soft · coronal · 0.33mm/px · 3 of 73 slices shown]
[im 25/73  brain]
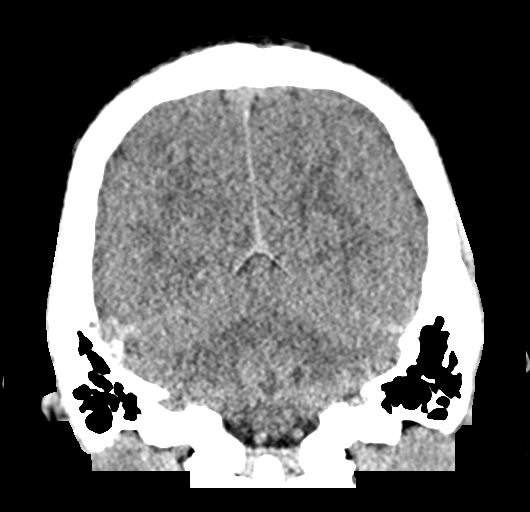
[im 33/73  brain]
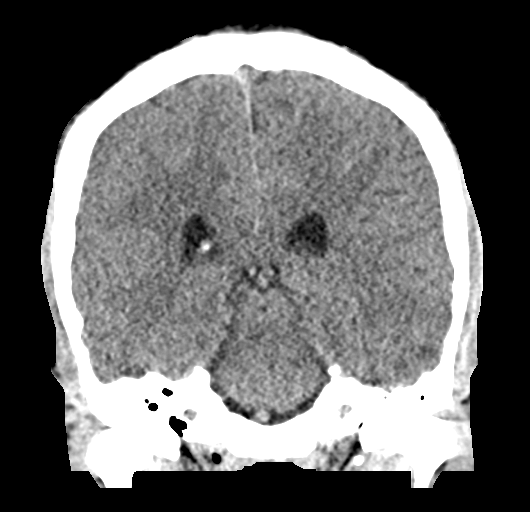
[im 41/73  brain]
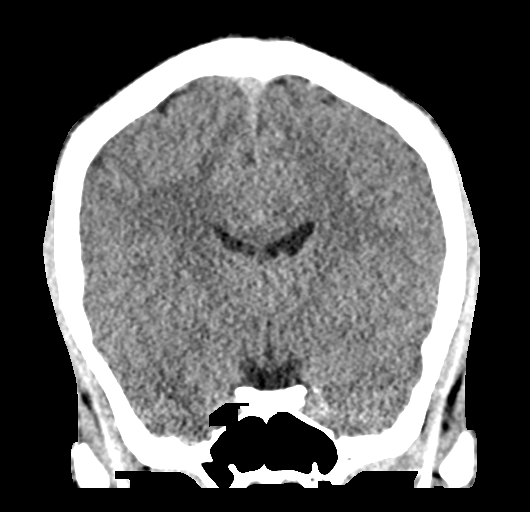

[Series 5: sag soft · sagittal · 0.34mm/px · 3 of 56 slices shown]
[im 19/56  brain]
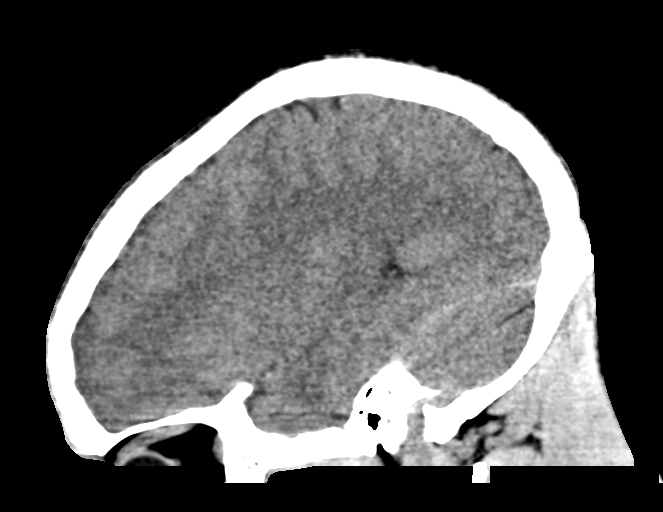
[im 28/56  brain]
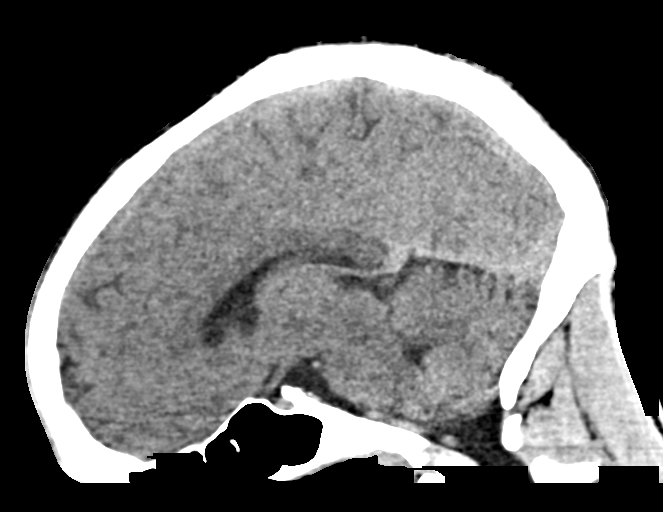
[im 37/56  brain]
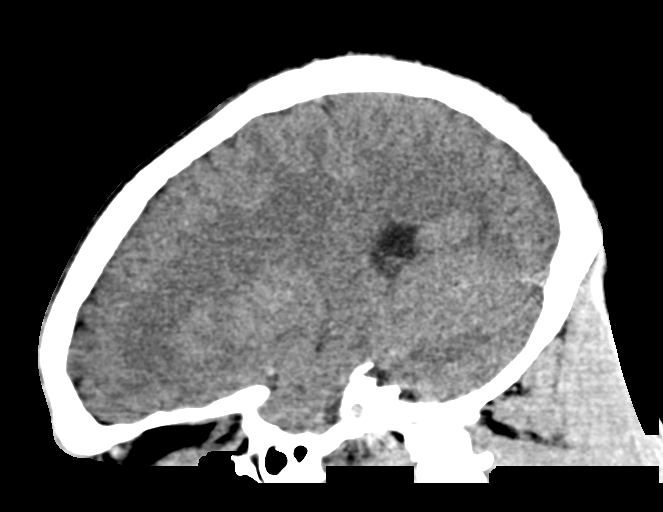

[16 of 47 positions shown; findings below may reference images not displayed]

FINDINGS: Somewhat limited due to motion and bone artifacts.

Brain: No acute intracranial hemorrhage, mass effect, or herniation.
No extra-axial fluid collections. No evidence of acute territorial
infarct. No hydrocephalus.

Vascular: No hyperdense vessel or unexpected calcification.

Skull: Normal. Negative for fracture or focal lesion.

Sinuses/Orbits: No acute finding.

Other: None.
IMPRESSION: No acute intracranial process identified.

## 2022-10-28 ENCOUNTER — Encounter (HOSPITAL_BASED_OUTPATIENT_CLINIC_OR_DEPARTMENT_OTHER): Payer: Self-pay | Admitting: Emergency Medicine

## 2022-10-28 ENCOUNTER — Other Ambulatory Visit: Payer: Self-pay

## 2022-10-28 ENCOUNTER — Emergency Department (HOSPITAL_BASED_OUTPATIENT_CLINIC_OR_DEPARTMENT_OTHER)
Admission: EM | Admit: 2022-10-28 | Discharge: 2022-10-28 | Disposition: A | Discharge status: Home / Self Care | Payer: Medicaid Other | Attending: Emergency Medicine | Admitting: Emergency Medicine

## 2022-10-28 DIAGNOSIS — J029 Acute pharyngitis, unspecified: Secondary | ICD-10-CM

## 2022-10-28 DIAGNOSIS — J101 Influenza due to other identified influenza virus with other respiratory manifestations: Secondary | ICD-10-CM

## 2022-10-28 DIAGNOSIS — Z1152 Encounter for screening for COVID-19: Secondary | ICD-10-CM | POA: Insufficient documentation

## 2022-10-28 LAB — RESP PANEL BY RT-PCR (RSV, FLU A&B, COVID)  RVPGX2
Influenza A by PCR: NEGATIVE
Influenza B by PCR: POSITIVE — AB
Resp Syncytial Virus by PCR: NEGATIVE
SARS Coronavirus 2 by RT PCR: NEGATIVE

## 2022-10-28 LAB — GROUP A STREP BY PCR: Group A Strep by PCR: NOT DETECTED

## 2022-10-28 MED ORDER — ACETAMINOPHEN 325 MG PO TABS
650.0000 mg | ORAL_TABLET | Freq: Once | ORAL | Status: AC
  Administered 2022-10-28: 650 mg via ORAL
  Filled 2022-10-28: qty 2

## 2022-10-28 MED ORDER — BENZONATATE 100 MG PO CAPS: 100.0000 mg | ORAL_CAPSULE | Freq: Three times a day (TID) | ORAL | 0 refills | Status: AC

## 2022-10-28 NOTE — Discharge Instructions (Addendum)
It was a pleasure taking care of you today!   Your COVID and RSV swabs were negative today. Your flu swab was positive for Flu B. You will be sent a prescription for tessalon perles to aid with your cough, take as directed. You may use over the counter chloraseptic spray/lozenges, vicks vapo spray to aid with your sore throat. You may take over the counter 600 mg ibuprofen every 6 hours as needed for your symptoms. If you are taking over the counter cough and cold medications (Dayquil, Nyquil, Robitussin, Mucinex, etc.) do not take tylenol at the same time as you take these medications. You can alternate these medications with the ibuprofen. Ensure to maintain fluid intake with water, tea with honey, broth, soup, pedialyte, gatorade. Follow up with your primary care provider regarding todays ED visit. Ensure that you are wearing your mask and practicing good hand hygiene. Return to the ED if you are experiencing increasing/worsening symptoms.  

## 2022-10-28 NOTE — ED Triage Notes (Signed)
Pt c/o sore throat x few days

## 2022-10-28 NOTE — ED Provider Notes (Signed)
[facility] Provider Note   CSN: [id number] Arrival date & time: 10/28/22  1552     History  Chief Complaint  Patient presents with   Sore Throat    Richard Lester is a 27 y.o. male who presents to the ED with concerns for sore throat onset 3 days. Has sick contacts at home with similar symptoms. Has associated cough, painful swallowing, generalized body aches. Has tried OTC chloraseptic spray for his symptoms. Denies trouble swallowing, trouble breathing.   The history is provided by the patient. No language interpreter was used.       Home Medications Prior to Admission medications   Medication Sig Start Date End Date Taking? Authorizing Provider  benzonatate (TESSALON) 100 MG capsule Take 1 capsule (100 mg total) by mouth every 8 (eight) hours. 10/28/22  Yes Otho Michalik A, PA-C  albuterol (VENTOLIN HFA) 108 (90 Base) MCG/ACT inhaler Inhale 1-2 puffs into the lungs every 6 (six) hours as needed for wheezing or shortness of breath. 06/22/19   Lamptey, Myrene Galas, MD  albuterol (VENTOLIN HFA) 108 (90 Base) MCG/ACT inhaler Inhale 1-2 puffs into the lungs every 6 (six) hours as needed for wheezing or shortness of breath. 02/06/22   Jeanell Sparrow, DO  Albuterol Sulfate (PROAIR RESPICLICK) 601 (90 Base) MCG/ACT AEPB Inhale 1-2 puffs into the lungs every 4 (four) hours as needed (wheezing). 10/16/20   Zigmund Gottron, NP  budesonide-formoterol (SYMBICORT) 80-4.5 MCG/ACT inhaler Inhale 2 puffs into the lungs 2 (two) times daily. 12/02/21   Sherrill Raring, PA-C  dicyclomine (BENTYL) 20 MG tablet Take 1 tablet (20 mg total) by mouth 2 (two) times daily. 02/06/19   Carmin Muskrat, MD  predniSONE (DELTASONE) 10 MG tablet Take 2 tablets (20 mg total) by mouth daily. 12/02/21   Sherrill Raring, PA-C      Allergies    Patient has no known allergies.    Review of Systems   Review of Systems  All other systems reviewed and are negative.   Physical Exam Updated Vital  Signs BP 109/74   Pulse 77   Temp (!) 100.7 F (38.2 C) (Oral)   Resp 20   Ht 5\' 11"  (1.803 m)   Wt 72.6 kg   SpO2 100%   BMI 22.32 kg/m  Physical Exam Vitals and nursing note reviewed.  Constitutional:      General: He is not in acute distress.    Appearance: Normal appearance.  HENT:     Mouth/Throat:     Mouth: Mucous membranes are moist.     Pharynx: Oropharynx is clear. Uvula midline. No posterior oropharyngeal erythema.     Tonsils: No tonsillar exudate.     Comments: Uvula midline. No posterior pharyngeal erythema or tonsillar exudate noted. Patent airway. Pt able to speak in clear complete sentences. Tolerating oral secretions. Eyes:     General: No scleral icterus.    Extraocular Movements: Extraocular movements intact.  Cardiovascular:     Rate and Rhythm: Normal rate.  Pulmonary:     Effort: Pulmonary effort is normal. No respiratory distress.  Abdominal:     Palpations: Abdomen is soft. There is no mass.     Tenderness: There is no abdominal tenderness.  Musculoskeletal:        General: Normal range of motion.     Cervical back: Neck supple.  Skin:    General: Skin is warm and dry.     Findings: No rash.  Neurological:  Mental Status: He is alert.     Sensory: Sensation is intact.     Motor: Motor function is intact.  Psychiatric:        Behavior: Behavior normal.     ED Results / Procedures / Treatments   Labs (all labs ordered are listed, but only abnormal results are displayed) Labs Reviewed  RESP PANEL BY RT-PCR (RSV, FLU A&B, COVID)  RVPGX2 - Abnormal; Notable for the following components:      Result Value   Influenza B by PCR POSITIVE (*)    All other components within normal limits  GROUP A STREP BY PCR    EKG None  Radiology No results found.  Procedures Procedures    Medications Ordered in ED Medications  acetaminophen (TYLENOL) tablet 650 mg (650 mg Oral Given 10/28/22 1651)    ED Course/ Medical Decision Making/ A&P                              Medical Decision Making Risk OTC drugs. Prescription drug management.   Pt presents with sore throat onset 3 days. Sick contacts at home. Vital signs, pt febrile at 100.7. On exam, pt with uvula midline. No posterior pharyngeal erythema or tonsillar exudate noted. Patent airway. Pt able to speak in clear complete sentences. Tolerating oral secretions. No acute cardiovascular, respiratory exam findings. Differential diagnosis includes COVID, flu, RSV, strep pharyngitis, viral pharyngitis, viral URI with cough, PNA.   Labs:  I ordered, and personally interpreted labs.  The pertinent results include:   COVID swab negative Flu swab positive for Flu B Strep swab negative RSV swab negative   Medications:  I ordered medication including tylenol for symptom management I have reviewed the patients home medicines and have made adjustments as needed   Disposition: Presentation suspicious for Flu B. Doubt COVID, RSV, Strep pharyngitis, or PNA at this time. Discussed with patient Tamiflu, however, patient symptoms > 48 hours for initiation for Tamiflu. After consideration of the diagnostic results and the patients response to treatment, I feel that the patient would benefit from Discharge home. Rx for tessalon perles sent. Work note provided. Supportive care measures and strict return precautions discussed with patient at bedside. Pt acknowledges and verbalizes understanding. Pt appears safe for discharge. Follow up as indicated in discharge paperwork.    This chart was dictated using voice recognition software, Dragon. Despite the best efforts of this provider to proofread and correct errors, errors may still occur which can change documentation meaning.   Final Clinical Impression(s) / ED Diagnoses Final diagnoses:  Viral pharyngitis  Influenza B    Rx / DC Orders ED Discharge Orders          Ordered    benzonatate (TESSALON) 100 MG capsule  Every 8 hours         10/28/22 1705              Basil Blakesley A, PA-C 10/28/22 1719    Drenda Freeze, MD 10/28/22 [phone]

## 2022-10-28 NOTE — ED Notes (Signed)
Discharge instructions reviewed with patient. Patient verbalizes understanding, no further questions at this time. Medications/prescriptions and follow up information provided. No acute distress noted at time of departure.  

## 2022-11-23 ENCOUNTER — Other Ambulatory Visit (HOSPITAL_BASED_OUTPATIENT_CLINIC_OR_DEPARTMENT_OTHER): Payer: Self-pay

## 2022-11-23 ENCOUNTER — Emergency Department (HOSPITAL_BASED_OUTPATIENT_CLINIC_OR_DEPARTMENT_OTHER)
Admission: EM | Admit: 2022-11-23 | Discharge: 2022-11-23 | Disposition: A | Discharge status: Home / Self Care | Payer: Self-pay | Attending: Emergency Medicine | Admitting: Emergency Medicine

## 2022-11-23 ENCOUNTER — Other Ambulatory Visit: Payer: Self-pay

## 2022-11-23 ENCOUNTER — Encounter (HOSPITAL_BASED_OUTPATIENT_CLINIC_OR_DEPARTMENT_OTHER): Payer: Self-pay

## 2022-11-23 DIAGNOSIS — J069 Acute upper respiratory infection, unspecified: Secondary | ICD-10-CM | POA: Insufficient documentation

## 2022-11-23 DIAGNOSIS — Z1152 Encounter for screening for COVID-19: Secondary | ICD-10-CM | POA: Insufficient documentation

## 2022-11-23 DIAGNOSIS — J4521 Mild intermittent asthma with (acute) exacerbation: Secondary | ICD-10-CM | POA: Insufficient documentation

## 2022-11-23 LAB — RESP PANEL BY RT-PCR (RSV, FLU A&B, COVID)  RVPGX2
Influenza A by PCR: NEGATIVE
Influenza B by PCR: NEGATIVE
Resp Syncytial Virus by PCR: NEGATIVE
SARS Coronavirus 2 by RT PCR: NEGATIVE

## 2022-11-23 MED ORDER — IBUPROFEN 600 MG PO TABS: 600.0000 mg | ORAL_TABLET | Freq: Four times a day (QID) | ORAL | 0 refills | Status: DC | PRN

## 2022-11-23 MED ORDER — ACETAMINOPHEN 325 MG PO TABS
650.0000 mg | ORAL_TABLET | Freq: Once | ORAL | Status: AC | PRN
  Administered 2022-11-23: 650 mg via ORAL
  Filled 2022-11-23: qty 2

## 2022-11-23 MED ORDER — PREDNISONE 50 MG PO TABS
50.0000 mg | ORAL_TABLET | Freq: Every day | ORAL | 0 refills | Status: AC
  Filled 2022-11-23: qty 5, 5d supply, fill #0

## 2022-11-23 MED ORDER — IBUPROFEN 800 MG PO TABS
800.0000 mg | ORAL_TABLET | Freq: Once | ORAL | Status: AC
Start: 2022-11-23 — End: 2022-11-23
  Administered 2022-11-23: 800 mg via ORAL
  Filled 2022-11-23: qty 1

## 2022-11-23 MED ORDER — BUDESONIDE-FORMOTEROL FUMARATE 80-4.5 MCG/ACT IN AERO: 2.0000 | INHALATION_SPRAY | Freq: Two times a day (BID) | RESPIRATORY_TRACT | 2 refills | Status: DC

## 2022-11-23 MED ORDER — BUDESONIDE-FORMOTEROL FUMARATE 80-4.5 MCG/ACT IN AERO
2.0000 | INHALATION_SPRAY | Freq: Two times a day (BID) | RESPIRATORY_TRACT | 2 refills | Status: AC
  Filled 2022-11-23: qty 10.2, 30d supply, fill #0

## 2022-11-23 MED ORDER — AEROCHAMBER PLUS FLO-VU LARGE MISC
1.0000 | Freq: Once | Status: AC
Start: 2022-11-23 — End: 2022-11-23
  Administered 2022-11-23: 1
  Filled 2022-11-23: qty 1

## 2022-11-23 MED ORDER — ALBUTEROL SULFATE HFA 108 (90 BASE) MCG/ACT IN AERS
2.0000 | INHALATION_SPRAY | RESPIRATORY_TRACT | Status: DC | PRN
  Administered 2022-11-23: 2 via RESPIRATORY_TRACT
  Filled 2022-11-23: qty 6.7

## 2022-11-23 MED ORDER — IBUPROFEN 600 MG PO TABS
600.0000 mg | ORAL_TABLET | Freq: Four times a day (QID) | ORAL | 0 refills | Status: AC | PRN
  Filled 2022-11-23: qty 30, 8d supply, fill #0

## 2022-11-23 MED ORDER — PREDNISONE 50 MG PO TABS: 50.0000 mg | ORAL_TABLET | Freq: Every day | ORAL | 0 refills | Status: DC

## 2022-11-23 MED ORDER — ALBUTEROL SULFATE (2.5 MG/3ML) 0.083% IN NEBU
2.5000 mg | INHALATION_SOLUTION | Freq: Once | RESPIRATORY_TRACT | Status: AC
  Administered 2022-11-23: 2.5 mg via RESPIRATORY_TRACT
  Filled 2022-11-23: qty 3

## 2022-11-23 MED ORDER — IPRATROPIUM-ALBUTEROL 0.5-2.5 (3) MG/3ML IN SOLN
3.0000 mL | Freq: Once | RESPIRATORY_TRACT | Status: AC
  Administered 2022-11-23: 3 mL via RESPIRATORY_TRACT
  Filled 2022-11-23: qty 3

## 2022-11-23 NOTE — ED Provider Notes (Signed)
[facility] Provider Note  CSN: [id number] Arrival date & time: 11/23/22 7782  Chief Complaint(s) Asthma and Fever  HPI Richard Lester is a 27 y.o. male with history of asthma presenting to the emergency department with shortness of breath.  Patient reports that he has associated subjective fevers and chills, body aches, cough, chest tightness with coughing.  He denies any nausea, vomiting, diarrhea.  No fainting.  Used inhaler at home without significant improvement.  Has sick contacts at work.  No recent travel or surgeries.  No productive cough.   Past Medical History Past Medical History:  Diagnosis Date   Asthma    There are no problems to display for this patient.  Home Medication(s) Prior to Admission medications   Medication Sig Start Date End Date Taking? Authorizing Provider  ibuprofen (ADVIL) 600 MG tablet Take 1 tablet (600 mg total) by mouth every 6 (six) hours as needed for fever or mild pain. 11/23/22  Yes Cristie Hem, MD  benzonatate (TESSALON) 100 MG capsule Take 1 capsule (100 mg total) by mouth every 8 (eight) hours. 10/28/22   Blue, Soijett A, PA-C  budesonide-formoterol (SYMBICORT) 80-4.5 MCG/ACT inhaler Inhale 2 puffs into the lungs 2 (two) times daily. 11/23/22   Cristie Hem, MD  dicyclomine (BENTYL) 20 MG tablet Take 1 tablet (20 mg total) by mouth 2 (two) times daily. 02/06/19   Carmin Muskrat, MD  predniSONE (DELTASONE) 50 MG tablet Take 1 tablet (50 mg total) by mouth daily for 5 days. 11/23/22 11/28/22  Cristie Hem, MD                                                                                                                                    Past Surgical History History reviewed. No pertinent surgical history. Family History Family History  Problem Relation Age of Onset   Healthy Mother     Social History Social History   Tobacco Use   Smoking status: Never   Smokeless  tobacco: Never  Vaping Use   Vaping Use: Never used  Substance Use Topics   Alcohol use: Never   Drug use: Never   Allergies Patient has no known allergies.  Review of Systems Review of Systems  All other systems reviewed and are negative.   Physical Exam Vital Signs  I have reviewed the triage vital signs BP 132/77   Pulse 89   Temp 99.3 F (37.4 C) (Oral)   Resp 15   Ht 5\' 11"  (1.803 m)   Wt 72.6 kg   SpO2 100%   BMI 22.32 kg/m  Physical Exam Vitals and nursing note reviewed.  Constitutional:      General: He is not in acute distress.    Appearance: Normal appearance.  HENT:     Mouth/Throat:     Mouth: Mucous membranes are moist.  Eyes:     Conjunctiva/sclera: Conjunctivae  normal.  Cardiovascular:     Rate and Rhythm: Normal rate and regular rhythm.  Pulmonary:     Effort: Pulmonary effort is normal. No respiratory distress.     Breath sounds: Wheezing (mild diffuse) present. No rhonchi or rales.  Abdominal:     General: Abdomen is flat.     Palpations: Abdomen is soft.     Tenderness: There is no abdominal tenderness.  Musculoskeletal:     Right lower leg: No edema.     Left lower leg: No edema.  Skin:    General: Skin is warm and dry.     Capillary Refill: Capillary refill takes less than 2 seconds.  Neurological:     Mental Status: He is alert and oriented to person, place, and time. Mental status is at baseline.  Psychiatric:        Mood and Affect: Mood normal.        Behavior: Behavior normal.     ED Results and Treatments Labs (all labs ordered are listed, but only abnormal results are displayed) Labs Reviewed  RESP PANEL BY RT-PCR (RSV, FLU A&B, COVID)  RVPGX2                                                                                                                          Radiology No results found.  Pertinent labs & imaging results that were available during my care of the patient were reviewed by me and considered in my  medical decision making (see MDM for details).  Medications Ordered in ED Medications  albuterol (VENTOLIN HFA) 108 (90 Base) MCG/ACT inhaler 2 puff (2 puffs Inhalation Given 11/23/22 0711)  albuterol (PROVENTIL) (2.5 MG/3ML) 0.083% nebulizer solution 2.5 mg (2.5 mg Nebulization Given 11/23/22 0710)  ipratropium-albuterol (DUONEB) 0.5-2.5 (3) MG/3ML nebulizer solution 3 mL (3 mLs Nebulization Given 11/23/22 0710)  AeroChamber Plus Flo-Vu Large MISC 1 each (1 each Other Given 11/23/22 0711)  acetaminophen (TYLENOL) tablet 650 mg (650 mg Oral Given 11/23/22 0703)  ibuprofen (ADVIL) tablet 800 mg (800 mg Oral Given 11/23/22 0750)                                                                                                                                     Procedures Procedures  (including critical care time)  Medical Decision Making / ED Course   MDM:  27 year old male presenting to the emergency  department with shortness of breath.  Patient well-appearing, physical exam with mild wheezing.  Wheezing resolved on reassessment after breathing treatment.  Not febrile.  No tachycardia.  Suspect likely viral upper respiratory tract infection and asthma exacerbation.  No focal breath sounds to suggest pneumonia.  COVID/flu swab negative.  Will treat for asthma exacerbation.  Given chest tightness will check EKG although patient has reproducible chest pain on palpation, very low concern for other occult process such as PE, patient PERC negative, or ACS given age and comorbidities.  Doubt pneumothorax with equal breath sounds bilaterally and alternative cause of shortness of breath is present.  Doubt pneumonia with no focal breath sounds. Will discharge patient to home. All questions answered. Patient comfortable with plan of discharge. Return precautions discussed with patient and specified on the after visit summary.   Clinical Course as of 11/23/22 0814  Mon Nov 23, 2022  4742 ECG NSR, rate of  88, no acute ST or T wave changes. Unchanged from ECG 03/13/22 [WS]    Clinical Course User Index [WS] Cristie Hem, MD     Additional history obtained:  -External records from outside source obtained and reviewed including: Chart review including previous notes, labs, imaging, consultation notes including ED visit for pharyngitis 10/28/21   Lab Tests: -I ordered, reviewed, and interpreted labs.   The pertinent results include:   Labs Reviewed  RESP PANEL BY RT-PCR (RSV, FLU A&B, COVID)  RVPGX2    Notable for negative flu/COVID    Medicines ordered and prescription drug management: Meds ordered this encounter  Medications   albuterol (PROVENTIL) (2.5 MG/3ML) 0.083% nebulizer solution 2.5 mg   ipratropium-albuterol (DUONEB) 0.5-2.5 (3) MG/3ML nebulizer solution 3 mL   albuterol (VENTOLIN HFA) 108 (90 Base) MCG/ACT inhaler 2 puff   AeroChamber Plus Flo-Vu Large MISC 1 each   acetaminophen (TYLENOL) tablet 650 mg   ibuprofen (ADVIL) tablet 800 mg   budesonide-formoterol (SYMBICORT) 80-4.5 MCG/ACT inhaler    Sig: Inhale 2 puffs into the lungs 2 (two) times daily.    Dispense:  10.2 g    Refill:  2   predniSONE (DELTASONE) 50 MG tablet    Sig: Take 1 tablet (50 mg total) by mouth daily for 5 days.    Dispense:  5 tablet    Refill:  0   ibuprofen (ADVIL) 600 MG tablet    Sig: Take 1 tablet (600 mg total) by mouth every 6 (six) hours as needed for fever or mild pain.    Dispense:  30 tablet    Refill:  0    -I have reviewed the patients home medicines and have made adjustments as needed   Cardiac Monitoring: The patient was maintained on a cardiac monitor.  I personally viewed and interpreted the cardiac monitored which showed an underlying rhythm of: NSR  Reevaluation: After the interventions noted above, I reevaluated the patient and found that they have improved  Co morbidities that complicate the patient evaluation  Past Medical History:  Diagnosis Date    Asthma       Dispostion: Disposition decision including need for hospitalization was considered, and patient discharged from emergency department.    Final Clinical Impression(s) / ED Diagnoses Final diagnoses:  Viral upper respiratory tract infection  Mild intermittent asthma with exacerbation     This chart was dictated using voice recognition software.  Despite best efforts to proofread,  errors can occur which can change the documentation meaning.    Cristie Hem, MD  11/23/22 0814  

## 2022-11-23 NOTE — Discharge Instructions (Addendum)
Please take Tylenol and Motrin for your symptoms at home.  You can take 650 mg of Tylenol every 6 hours and 600 mg of ibuprofen every 6 hours as needed for your symptoms.  You can take these medicines together as needed, either at the same time, or alternating every 3 hours.

## 2022-11-23 NOTE — ED Triage Notes (Signed)
Fever/cough/chest tightness since yesterday. Using expired inhaler at home without relief. Wheezing on arrival. RT already assessed. Mucinex and Tylenol yesterday.

## 2023-03-30 ENCOUNTER — Other Ambulatory Visit: Payer: Self-pay

## 2023-03-30 ENCOUNTER — Emergency Department (HOSPITAL_BASED_OUTPATIENT_CLINIC_OR_DEPARTMENT_OTHER)
Admission: EM | Admit: 2023-03-30 | Discharge: 2023-03-30 | Disposition: A | Discharge status: Home / Self Care | Payer: Self-pay | Attending: Emergency Medicine | Admitting: Emergency Medicine

## 2023-03-30 ENCOUNTER — Encounter (HOSPITAL_BASED_OUTPATIENT_CLINIC_OR_DEPARTMENT_OTHER): Payer: Self-pay | Admitting: Urology

## 2023-03-30 DIAGNOSIS — J069 Acute upper respiratory infection, unspecified: Secondary | ICD-10-CM | POA: Insufficient documentation

## 2023-03-30 DIAGNOSIS — Z1152 Encounter for screening for COVID-19: Secondary | ICD-10-CM | POA: Insufficient documentation

## 2023-03-30 DIAGNOSIS — J029 Acute pharyngitis, unspecified: Secondary | ICD-10-CM

## 2023-03-30 LAB — GROUP A STREP BY PCR: Group A Strep by PCR: NOT DETECTED

## 2023-03-30 LAB — SARS CORONAVIRUS 2 BY RT PCR: SARS Coronavirus 2 by RT PCR: NEGATIVE

## 2023-03-30 NOTE — Discharge Instructions (Signed)
Your strep test and COVID test were negative today.  Use over-the-counter medications for symptom control as needed.  As we discussed, if you develop any swelling or sores on the lips or mouth, please see your doctor or return to determine need for treatment.

## 2023-03-30 NOTE — ED Notes (Signed)
Discharge instructions reviewed with patient. Patient verbalizes understanding, no further questions at this time. Follow up information provided. No acute distress noted at time of departure.  

## 2023-03-30 NOTE — ED Triage Notes (Signed)
Pt states was smoking black and mild on fathers day with another person and was told that they other guy had herpes Has had chills and cough since then and wants to be checked

## 2023-03-30 NOTE — ED Provider Notes (Signed)
[facility] Provider Note   CSN: [id number] Arrival date & time: 03/30/23  1531     History  Chief Complaint  Patient presents with   Chills    Richard Lester is a 27 y.o. male.  Patient presents to the emergency department today for evaluation of chills, sore throat and cough starting 2 days ago.  Patient states that he was smoking Black and milds with some friends.  Shortly after his symptoms presented.  He states that he was told yesterday that one of his friends has herpes which has made him concerned that maybe he contracted this.  He denies sores, lesions, swelling of the lips.  Pain with swallowing.  He has had subjective fever at home.  No ear pain.  He has a history of asthma and has been using albuterol inhaler for wheezing.       Home Medications Prior to Admission medications   Medication Sig Start Date End Date Taking? Authorizing Provider  benzonatate (TESSALON) 100 MG capsule Take 1 capsule (100 mg total) by mouth every 8 (eight) hours. 10/28/22   Blue, Soijett A, PA-C  budesonide-formoterol (SYMBICORT) 80-4.5 MCG/ACT inhaler Inhale 2 puffs into the lungs 2 (two) times daily. 11/23/22   Lonell Grandchild, MD  dicyclomine (BENTYL) 20 MG tablet Take 1 tablet (20 mg total) by mouth 2 (two) times daily. 02/06/19   Gerhard Munch, MD  ibuprofen (ADVIL) 600 MG tablet Take 1 tablet (600 mg total) by mouth every 6 (six) hours as needed for fever or mild pain. 11/23/22   Lonell Grandchild, MD      Allergies    Patient has no known allergies.    Review of Systems   Review of Systems  Physical Exam Updated Vital Signs BP 118/75 (BP Location: Right Arm)   Pulse 72   Temp 98.6 F (37 C)   Resp 18   Ht 5\' 11"  (1.803 m)   Wt 68 kg   SpO2 100%   BMI 20.92 kg/m  Physical Exam Vitals and nursing note reviewed.  Constitutional:      Appearance: He is well-developed.  HENT:     Head: Normocephalic and atraumatic.      Jaw: No trismus.     Right Ear: Tympanic membrane, ear canal and external ear normal.     Left Ear: Tympanic membrane, ear canal and external ear normal.     Nose: Nose normal. No mucosal edema or rhinorrhea.     Mouth/Throat:     Mouth: Mucous membranes are not dry.     Pharynx: Uvula midline. Posterior oropharyngeal erythema present. No oropharyngeal exudate or uvula swelling.     Tonsils: No tonsillar abscesses.  Eyes:     General:        Right eye: No discharge.        Left eye: No discharge.     Conjunctiva/sclera: Conjunctivae normal.  Cardiovascular:     Rate and Rhythm: Normal rate and regular rhythm.     Heart sounds: Normal heart sounds.  Pulmonary:     Effort: Pulmonary effort is normal. No respiratory distress.     Breath sounds: Wheezing present. No rales.     Comments: Mild expiratory wheezing, scattered Abdominal:     Palpations: Abdomen is soft.     Tenderness: There is no abdominal tenderness.  Musculoskeletal:     Cervical back: Normal range of motion and neck supple.  Skin:    General: Skin  is warm and dry.  Neurological:     Mental Status: He is alert.     ED Results / Procedures / Treatments   Labs (all labs ordered are listed, but only abnormal results are displayed) Labs Reviewed  GROUP A STREP BY PCR  SARS CORONAVIRUS 2 BY RT PCR    EKG None  Radiology No results found.  Procedures Procedures    Medications Ordered in ED Medications - No data to display  ED Course/ Medical Decision Making/ A&P    Patient seen and examined. History obtained directly from patient.   Labs/EKG: Ordered strep and COVID testing.  Imaging: None ordered  Medications/Fluids: None ordered  Most recent vital signs reviewed and are as follows: BP 118/75 (BP Location: Right Arm)   Pulse 72   Temp 98.6 F (37 C)   Resp 18   Ht 5\' 11"  (1.803 m)   Wt 68 kg   SpO2 100%   BMI 20.92 kg/m   Initial impression: Respiratory infection, mild  I had a  discussion with the patient in regards to HSV testing and that if we did check this today, would not likely give him the answer he is looking for.  He agrees to defer.  Will check for strep and COVID.  I offered to call him with results, however he states that he would prefer to wait in the emergency room.  5:30 PM Reassessment performed. Patient appears stable.  Labs personally reviewed and interpreted including: COVID, flu negative.  Reviewed pertinent lab work and imaging with patient at bedside. Questions answered.   Most current vital signs reviewed and are as follows: BP 118/75 (BP Location: Right Arm)   Pulse 72   Temp 98.6 F (37 C)   Resp 18   Ht 5\' 11"  (1.803 m)   Wt 68 kg   SpO2 100%   BMI 20.92 kg/m   Plan: Discharge to home.   Prescriptions written for: None  Other home care instructions discussed: OTC meds, rest, hydration, home albuterol use  ED return instructions discussed: Facial or lip swelling, sores or lesions  Follow-up instructions discussed: Patient encouraged to follow-up with their PCP as needed.                              Medical Decision Making  Patient with sore throat and cough over the past couple of days.  COVID and strep negative.  He looks well.  He is concerned about possible HSV exposure, but is currently asymptomatic.  Will treat supportively at this time.          Final Clinical Impression(s) / ED Diagnoses Final diagnoses:  Upper respiratory tract infection, unspecified type  Sore throat    Rx / DC Orders ED Discharge Orders     None         Renne Crigler, PA-C 03/30/23 1731    Tegeler, Canary Brim, MD 03/30/23 1752

## 2023-06-15 ENCOUNTER — Encounter (HOSPITAL_BASED_OUTPATIENT_CLINIC_OR_DEPARTMENT_OTHER): Payer: Self-pay

## 2023-06-15 ENCOUNTER — Emergency Department (HOSPITAL_BASED_OUTPATIENT_CLINIC_OR_DEPARTMENT_OTHER)
Admission: EM | Admit: 2023-06-15 | Discharge: 2023-06-15 | Disposition: A | Discharge status: Home / Self Care | Payer: Self-pay | Attending: Emergency Medicine | Admitting: Emergency Medicine

## 2023-06-15 DIAGNOSIS — L739 Follicular disorder, unspecified: Secondary | ICD-10-CM | POA: Insufficient documentation

## 2023-06-15 DIAGNOSIS — N341 Nonspecific urethritis: Secondary | ICD-10-CM | POA: Insufficient documentation

## 2023-06-15 DIAGNOSIS — N342 Other urethritis: Secondary | ICD-10-CM

## 2023-06-15 LAB — URINALYSIS, MICROSCOPIC (REFLEX)

## 2023-06-15 LAB — URINALYSIS, ROUTINE W REFLEX MICROSCOPIC
Glucose, UA: NEGATIVE mg/dL
Hgb urine dipstick: NEGATIVE
Ketones, ur: NEGATIVE mg/dL
Leukocytes,Ua: NEGATIVE
Nitrite: NEGATIVE
Protein, ur: 30 mg/dL — AB
Specific Gravity, Urine: >=1.03 (ref 1.005–1.030)
pH: 6 (ref 5.0–8.0)

## 2023-06-15 LAB — HIV ANTIBODY (ROUTINE TESTING W REFLEX): HIV Screen 4th Generation wRfx: NONREACTIVE

## 2023-06-15 MED ORDER — CEFTRIAXONE SODIUM 500 MG IJ SOLR
500.0000 mg | Freq: Once | INTRAMUSCULAR | Status: AC
  Administered 2023-06-15: 500 mg via INTRAMUSCULAR
  Filled 2023-06-15: qty 500

## 2023-06-15 MED ORDER — DOXYCYCLINE HYCLATE 100 MG PO CAPS: 100.0000 mg | ORAL_CAPSULE | Freq: Two times a day (BID) | ORAL | 0 refills | Status: DC

## 2023-06-15 MED ORDER — LIDOCAINE HCL (PF) 1 % IJ SOLN
INTRAMUSCULAR | Status: AC
  Administered 2023-06-15: 5 mL
  Filled 2023-06-15: qty 5

## 2023-06-15 NOTE — ED Notes (Signed)
Reviewed discharge instructions, medications and follow up with pt. States understanding. No adverse reaction to ABT

## 2023-06-15 NOTE — ED Provider Notes (Signed)
[facility] Provider Note   CSN: [id number] Arrival date & time: 06/15/23  0710     History  Chief Complaint  Patient presents with   Groin Pain    Richard Lester is a 27 y.o. male.  Patient is a 27 year old male who presents with burning on urination.  He denies any penile discharge.  He says it has been going on for the last couple days.  He is sexually active.  He also has a small sore area under his testicular sac that he wanted checked out.  He reports that he had a burning type pain on his right foot but currently does not have any symptoms.  No associated back pain.  No other numbness or weakness in the leg.       Home Medications Prior to Admission medications   Medication Sig Start Date End Date Taking? Authorizing Provider  benzonatate (TESSALON) 100 MG capsule Take 1 capsule (100 mg total) by mouth every 8 (eight) hours. 10/28/22   Blue, Soijett A, PA-C  budesonide-formoterol (SYMBICORT) 80-4.5 MCG/ACT inhaler Inhale 2 puffs into the lungs 2 (two) times daily. 11/23/22   Lonell Grandchild, MD  dicyclomine (BENTYL) 20 MG tablet Take 1 tablet (20 mg total) by mouth 2 (two) times daily. 02/06/19   Gerhard Munch, MD  ibuprofen (ADVIL) 600 MG tablet Take 1 tablet (600 mg total) by mouth every 6 (six) hours as needed for fever or mild pain. 11/23/22   Lonell Grandchild, MD      Allergies    Patient has no known allergies.    Review of Systems   Review of Systems  Constitutional:  Negative for chills, diaphoresis, fatigue and fever.  HENT:  Negative for congestion, rhinorrhea and sneezing.   Eyes: Negative.   Respiratory:  Negative for cough, chest tightness and shortness of breath.   Cardiovascular:  Negative for chest pain and leg swelling.  Gastrointestinal:  Negative for abdominal pain, blood in stool, diarrhea, nausea and vomiting.  Genitourinary:  Positive for dysuria. Negative for flank pain, frequency and  hematuria.  Musculoskeletal:  Negative for arthralgias and back pain.  Skin:  Negative for rash.  Neurological:  Positive for numbness. Negative for dizziness, speech difficulty, weakness and headaches.    Physical Exam Updated Vital Signs BP 122/74   Pulse (!) 58   Temp 97.6 F (36.4 C)   Resp 16   Wt 69.9 kg   SpO2 100%   BMI 21.49 kg/m  Physical Exam HENT:     Head: Normocephalic and atraumatic.  Cardiovascular:     Rate and Rhythm: Normal rate.  Pulmonary:     Effort: Pulmonary effort is normal.  Abdominal:     General: Abdomen is flat.     Tenderness: There is no abdominal tenderness.  Genitourinary:    Comments: Patient has a very tiny papule in the skin under his scrotal sac.  It appears to be an ingrown here.  There is no purulence noted.  No induration or fluctuance.  No surrounding erythema.  No pain in the testicular area.  No lymphadenopathy noted.  No inguinal hernias noted.  No penile discharge noted.  Normal-appearing circumcised male genitalia. Musculoskeletal:        General: Normal range of motion.     Comments: Right foot is normal-appearing.  No warmth or erythema.  Pedal pulses are intact.  He has normal sensation and motor function in the leg.  No tenderness on  palpation.  Skin:    General: Skin is warm and dry.  Neurological:     General: No focal deficit present.     Mental Status: He is alert.     ED Results / Procedures / Treatments   Labs (all labs ordered are listed, but only abnormal results are displayed) Labs Reviewed  URINALYSIS, ROUTINE W REFLEX MICROSCOPIC - Abnormal; Notable for the following components:      Result Value   APPearance CLOUDY (*)    Bilirubin Urine SMALL (*)    Protein, ur 30 (*)    All other components within normal limits  URINALYSIS, MICROSCOPIC (REFLEX) - Abnormal; Notable for the following components:   Bacteria, UA RARE (*)    All other components within normal limits  RPR  HIV ANTIBODY (ROUTINE TESTING W  REFLEX)  GC/CHLAMYDIA PROBE AMP ([place]) NOT AT [facility]    EKG None  Radiology No results found.  Procedures Procedures    Medications Ordered in ED Medications  cefTRIAXone (ROCEPHIN) injection 500 mg (has no administration in time range)    ED Course/ Medical Decision Making/ A&P                                 Medical Decision Making Amount and/or Complexity of Data Reviewed Labs: ordered.  Risk Prescription drug management.   Patient presents with dysuria and concerns for an STD.  He has normal-appearing genitalia.  He has a very tiny papule that appears to be ingrown hair.  There is no suggestions of infection.  Advised warm compresses to this area and was given return precautions for this.  Urinalysis is unremarkable.  STD testing was performed.  I discussed whether he wanted to be presumptively treated and he does.  Will treat him with Rocephin and doxycycline.  Patient to follow-up his STD testing on MyChart.  He reported some intermittent numbness to the top of his right foot.  He does not have any associated back pain.  He does not currently have any symptoms.  The foot appears to be well-perfused and neurologically intact.  Patient was discharged home in good condition.  Return precautions were given.  Final Clinical Impression(s) / ED Diagnoses Final diagnoses:  Urethritis  Folliculitis    Rx / DC Orders ED Discharge Orders     None         Rolan Bucco, MD 06/15/23 [phone]

## 2023-06-15 NOTE — ED Triage Notes (Signed)
Pt reports burning to left groin area and right foot x 2 days. Mild burning with urination. No exposure to STD that he is aware of

## 2023-06-15 NOTE — Discharge Instructions (Signed)
Use warm compresses to the pimple area on your groin.  Return to emergency room if it gets worse.

## 2023-06-16 LAB — RPR: RPR Ser Ql: NONREACTIVE

## 2023-06-17 LAB — GC/CHLAMYDIA PROBE AMP (~~LOC~~) NOT AT ARMC
Chlamydia: NEGATIVE
Comment: NEGATIVE
Comment: NORMAL
Neisseria Gonorrhea: NEGATIVE

## 2023-07-13 ENCOUNTER — Emergency Department (HOSPITAL_BASED_OUTPATIENT_CLINIC_OR_DEPARTMENT_OTHER): Payer: Self-pay

## 2023-07-13 ENCOUNTER — Emergency Department (HOSPITAL_BASED_OUTPATIENT_CLINIC_OR_DEPARTMENT_OTHER)
Admission: EM | Admit: 2023-07-13 | Discharge: 2023-07-13 | Disposition: A | Discharge status: Home / Self Care | Payer: Self-pay | Attending: Emergency Medicine | Admitting: Emergency Medicine

## 2023-07-13 ENCOUNTER — Other Ambulatory Visit: Payer: Self-pay

## 2023-07-13 ENCOUNTER — Encounter (HOSPITAL_BASED_OUTPATIENT_CLINIC_OR_DEPARTMENT_OTHER): Payer: Self-pay | Admitting: Emergency Medicine

## 2023-07-13 DIAGNOSIS — Z7951 Long term (current) use of inhaled steroids: Secondary | ICD-10-CM | POA: Insufficient documentation

## 2023-07-13 DIAGNOSIS — J45909 Unspecified asthma, uncomplicated: Secondary | ICD-10-CM | POA: Insufficient documentation

## 2023-07-13 DIAGNOSIS — Z20822 Contact with and (suspected) exposure to covid-19: Secondary | ICD-10-CM | POA: Insufficient documentation

## 2023-07-13 DIAGNOSIS — R509 Fever, unspecified: Secondary | ICD-10-CM | POA: Insufficient documentation

## 2023-07-13 DIAGNOSIS — R519 Headache, unspecified: Secondary | ICD-10-CM | POA: Insufficient documentation

## 2023-07-13 DIAGNOSIS — R0602 Shortness of breath: Secondary | ICD-10-CM | POA: Insufficient documentation

## 2023-07-13 DIAGNOSIS — R079 Chest pain, unspecified: Secondary | ICD-10-CM | POA: Insufficient documentation

## 2023-07-13 LAB — RESP PANEL BY RT-PCR (RSV, FLU A&B, COVID)  RVPGX2
Influenza A by PCR: NEGATIVE
Influenza B by PCR: NEGATIVE
Resp Syncytial Virus by PCR: NEGATIVE
SARS Coronavirus 2 by RT PCR: NEGATIVE

## 2023-07-13 MED ORDER — ALBUTEROL SULFATE HFA 108 (90 BASE) MCG/ACT IN AERS
1.0000 | INHALATION_SPRAY | Freq: Once | RESPIRATORY_TRACT | Status: AC
  Administered 2023-07-13: 1 via RESPIRATORY_TRACT
  Filled 2023-07-13: qty 6.7

## 2023-07-13 MED ORDER — MOMETASONE FURO-FORMOTEROL FUM 100-5 MCG/ACT IN AERO: 2.0000 | INHALATION_SPRAY | Freq: Two times a day (BID) | RESPIRATORY_TRACT | Status: DC

## 2023-07-13 MED ORDER — FLUTICASONE PROPIONATE HFA 110 MCG/ACT IN AERO: 1.0000 | INHALATION_SPRAY | Freq: Two times a day (BID) | RESPIRATORY_TRACT | Status: DC

## 2023-07-13 MED ORDER — ACETAMINOPHEN 325 MG PO TABS
650.0000 mg | ORAL_TABLET | Freq: Once | ORAL | Status: AC
  Administered 2023-07-13: 650 mg via ORAL
  Filled 2023-07-13: qty 2

## 2023-07-13 MED ORDER — FLUTICASONE PROPIONATE HFA 44 MCG/ACT IN AERO
2.0000 | INHALATION_SPRAY | Freq: Two times a day (BID) | RESPIRATORY_TRACT | Status: DC
  Administered 2023-07-13: 2 via RESPIRATORY_TRACT
  Filled 2023-07-13: qty 10.6

## 2023-07-13 MED ORDER — BUDESONIDE-FORMOTEROL FUMARATE 80-4.5 MCG/ACT IN AERO: 2.0000 | INHALATION_SPRAY | Freq: Two times a day (BID) | RESPIRATORY_TRACT | 1 refills | Status: AC

## 2023-07-13 NOTE — ED Triage Notes (Signed)
Fever, body aches , chills , headache, no sore throat , cough

## 2023-07-13 NOTE — ED Notes (Signed)
ED Provider at bedside. 

## 2023-07-13 NOTE — Discharge Instructions (Signed)
You tested negative for COVID, flu, RSV today.  Chest x-ray showed no pneumonia.  Please take tylenol/ibuprofen for fever/pain. I recommend close follow-up with PCP for reevaluation.  Please do not hesitate to return to emergency department if worrisome signs symptoms we discussed become apparent.

## 2023-07-13 NOTE — ED Provider Notes (Signed)
[facility] Provider Note   CSN: [id number] Arrival date & time: 07/13/23  1614     History {Add pertinent medical, surgical, social history, OB history to HPI:1} Chief Complaint  Patient presents with   Fever    Richard Lester is a 27 y.o. male with a past medical history of asthma presents today for evaluation of fever, body aches, chills, headache since yesterday.  Patient has a history of asthma and has tried albuterol inhaler at home with no relief.  He does have a fever of 101 here today. He endorses intermittent chest pain, nonexertional, non pleuritic. Denies any cough, sore throat, runny nose.   Fever   Past Medical History:  Diagnosis Date   Asthma    History reviewed. No pertinent surgical history.   Home Medications Prior to Admission medications   Medication Sig Start Date End Date Taking? Authorizing Provider  benzonatate (TESSALON) 100 MG capsule Take 1 capsule (100 mg total) by mouth every 8 (eight) hours. 10/28/22   Blue, Soijett A, PA-C  budesonide-formoterol (SYMBICORT) 80-4.5 MCG/ACT inhaler Inhale 2 puffs into the lungs 2 (two) times daily. 11/23/22   Lonell Grandchild, MD  dicyclomine (BENTYL) 20 MG tablet Take 1 tablet (20 mg total) by mouth 2 (two) times daily. 02/06/19   Gerhard Munch, MD  doxycycline (VIBRAMYCIN) 100 MG capsule Take 1 capsule (100 mg total) by mouth 2 (two) times daily. One po bid x 7 days 06/15/23   Rolan Bucco, MD  ibuprofen (ADVIL) 600 MG tablet Take 1 tablet (600 mg total) by mouth every 6 (six) hours as needed for fever or mild pain. 11/23/22   Lonell Grandchild, MD      Allergies    Patient has no known allergies.    Review of Systems   Review of Systems  Constitutional:  Positive for fever.    Physical Exam Updated Vital Signs BP 127/79 (BP Location: Right Arm)   Pulse 89   Temp (!) 101 F (38.3 C)   Resp 18   Wt 68 kg   SpO2 100%   BMI 20.92 kg/m  Physical  Exam Vitals and nursing note reviewed.  Constitutional:      Appearance: Normal appearance.  HENT:     Head: Normocephalic and atraumatic.     Mouth/Throat:     Mouth: Mucous membranes are moist.  Eyes:     General: No scleral icterus. Cardiovascular:     Rate and Rhythm: Normal rate and regular rhythm.     Pulses: Normal pulses.     Heart sounds: Normal heart sounds.     Comments: Reproducible tenderness to palpation to right-sided chest wall near the right lower rib cage. Pulmonary:     Effort: Pulmonary effort is normal.     Breath sounds: Wheezing present.  Abdominal:     General: Abdomen is flat.     Palpations: Abdomen is soft.     Tenderness: There is no abdominal tenderness.  Musculoskeletal:        General: No deformity.  Skin:    General: Skin is warm.     Findings: No rash.  Neurological:     General: No focal deficit present.     Mental Status: He is alert.  Psychiatric:        Mood and Affect: Mood normal.     ED Results / Procedures / Treatments   Labs (all labs ordered are listed, but only abnormal results are displayed)  Labs Reviewed  RESP PANEL BY RT-PCR (RSV, FLU A&B, COVID)  [id number]    EKG None  Radiology No results found.  Procedures Procedures  {Document cardiac monitor, telemetry assessment procedure when appropriate:1}  Medications Ordered in ED Medications  acetaminophen (TYLENOL) tablet 650 mg (650 mg Oral Given 07/13/23 1645)    ED Course/ Medical Decision Making/ A&P   {   Click here for ABCD2, HEART and other calculatorsREFRESH Note before signing :1}                              Medical Decision Making Amount and/or Complexity of Data Reviewed Labs: ordered. Radiology: ordered.  Risk OTC drugs. Prescription drug management.   This patient presents to the ED for fever, chills, chest pain, shortness of breath, this involves an extensive number of treatment options, and is a complaint that carries with a high risk of  complications and morbidity.  The differential diagnosis includes CHF/ACS, COPD asthma, pneumonia, anaphylaxis, PE, pneumothorax, anxiety, COVID-19.  This is not an exhaustive list.  Lab tests: I ordered and personally interpreted labs.  The pertinent results include: Viral panel is negative.  CBC, BMP, troponin ordered and pending.  Imaging studies: I ordered imaging studies, personally reviewed, interpreted imaging and agree with the radiologist's interpretations. The results include: Chest x-ray ordered and pending.  Problem list/ ED course/ Critical interventions/ Medical management: HPI: See above Vital signs within normal range and stable throughout visit. Laboratory/imaging studies significant for: See above. On physical examination, patient is afebrile and appears in no acute distress.  There was wheezing on auscultation of right lower lung field.  Given a dose of albuterol inhaler here.  Likely asthma exacerbation but cannot rule out pneumonia, ACS etiology at this point.  Unlikely PE, patient is PERC negative. I have reviewed the patient home medicines and have made adjustments as needed.  Cardiac monitoring/EKG: The patient was maintained on a cardiac monitor.  I personally reviewed and interpreted the cardiac monitor which showed an underlying rhythm of: sinus rhythm.  Additional history obtained: External records from outside source obtained and reviewed including: Chart review including previous notes, labs, imaging.  Consultations obtained:  Disposition Patient's care signed off shift change to evaluation, PA-C with pending labs and chest x-ray. This chart was dictated using voice recognition software.  Despite best efforts to proofread,  errors can occur which can change the documentation meaning.    {Document critical care time when appropriate:1} {Document review of labs and clinical decision tools ie heart score, Chads2Vasc2 etc:1}  {Document your independent review  of radiology images, and any outside records:1} {Document your discussion with family members, caretakers, and with consultants:1} {Document social determinants of health affecting pt's care:1} {Document your decision making why or why not admission, treatments were needed:1} Final Clinical Impression(s) / ED Diagnoses Final diagnoses:  Fever, unspecified fever cause  Shortness of breath  Chest pain, unspecified type    Rx / DC Orders ED Discharge Orders     None

## 2023-10-02 ENCOUNTER — Other Ambulatory Visit: Payer: Self-pay

## 2023-10-02 ENCOUNTER — Emergency Department (HOSPITAL_BASED_OUTPATIENT_CLINIC_OR_DEPARTMENT_OTHER)
Admission: EM | Admit: 2023-10-02 | Discharge: 2023-10-02 | Disposition: A | Discharge status: Home / Self Care | Payer: Self-pay | Attending: Emergency Medicine | Admitting: Emergency Medicine

## 2023-10-02 ENCOUNTER — Encounter (HOSPITAL_BASED_OUTPATIENT_CLINIC_OR_DEPARTMENT_OTHER): Payer: Self-pay

## 2023-10-02 DIAGNOSIS — W268XXA Contact with other sharp object(s), not elsewhere classified, initial encounter: Secondary | ICD-10-CM | POA: Insufficient documentation

## 2023-10-02 DIAGNOSIS — Y93G9 Activity, other involving cooking and grilling: Secondary | ICD-10-CM | POA: Insufficient documentation

## 2023-10-02 DIAGNOSIS — Y92019 Unspecified place in single-family (private) house as the place of occurrence of the external cause: Secondary | ICD-10-CM | POA: Insufficient documentation

## 2023-10-02 DIAGNOSIS — S61211A Laceration without foreign body of left index finger without damage to nail, initial encounter: Secondary | ICD-10-CM | POA: Insufficient documentation

## 2023-10-02 NOTE — ED Provider Notes (Signed)
[facility] Provider Note   CSN: [id number] Arrival date & time: 10/02/23  1720     History  Chief Complaint  Patient presents with   Finger Injury    Richard Lester is a 26 y.o. male.  HPI   27 year old male presents emergency department with complaints of finger laceration.  Patient states that he was chopping onions at home preparing for dinner when he cut his left index finger.  States he ran under water immediately afterwards and was unable to get the bleeding stopped probably admitted to the emergency department.  Denies any weakness or sensory deficits in affected digit.  Denies trauma elsewhere.  Past medical history significant for asthma  Home Medications Prior to Admission medications   Medication Sig Start Date End Date Taking? Authorizing Provider  benzonatate (TESSALON) 100 MG capsule Take 1 capsule (100 mg total) by mouth every 8 (eight) hours. 10/28/22   Blue, Soijett A, PA-C  budesonide-formoterol (SYMBICORT) 80-4.5 MCG/ACT inhaler Inhale 2 puffs into the lungs 2 (two) times daily. 11/23/22   Lonell Grandchild, MD  budesonide-formoterol (SYMBICORT) 80-4.5 MCG/ACT inhaler Inhale 2 puffs into the lungs in the morning and at bedtime. 07/13/23   Jeanelle Malling, PA  dicyclomine (BENTYL) 20 MG tablet Take 1 tablet (20 mg total) by mouth 2 (two) times daily. 02/06/19   Gerhard Munch, MD  doxycycline (VIBRAMYCIN) 100 MG capsule Take 1 capsule (100 mg total) by mouth 2 (two) times daily. One po bid x 7 days 06/15/23   Rolan Bucco, MD  ibuprofen (ADVIL) 600 MG tablet Take 1 tablet (600 mg total) by mouth every 6 (six) hours as needed for fever or mild pain. 11/23/22   Lonell Grandchild, MD      Allergies    Patient has no known allergies.    Review of Systems   Review of Systems  All other systems reviewed and are negative.   Physical Exam Updated Vital Signs BP 121/70 (BP Location: Right Arm)   Pulse 66   Temp 98.3 F  (36.8 C) (Oral)   Resp 20   Ht 5\' 11"  (1.803 m)   Wt 72.6 kg   SpO2 100%   BMI 22.32 kg/m  Physical Exam Vitals and nursing note reviewed.  Constitutional:      General: He is not in acute distress.    Appearance: He is well-developed.  HENT:     Head: Normocephalic and atraumatic.  Eyes:     Conjunctiva/sclera: Conjunctivae normal.  Cardiovascular:     Rate and Rhythm: Normal rate and regular rhythm.     Heart sounds: No murmur heard. Pulmonary:     Effort: Pulmonary effort is normal. No respiratory distress.     Breath sounds: Normal breath sounds.  Abdominal:     Palpations: Abdomen is soft.     Tenderness: There is no abdominal tenderness.  Musculoskeletal:        General: No swelling.     Cervical back: Neck supple.     Comments: Full motion of second digit of left hand.  2.5 to 2.6 cm laceration noted on the lateral aspect of second digit distal phalanx.  No obvious nail involvement.  No foreign body.  Bleeding noticed upon evaluation.  Skin:    General: Skin is warm and dry.     Capillary Refill: Capillary refill takes less than 2 seconds.  Neurological:     Mental Status: He is alert.  Psychiatric:  Mood and Affect: Mood normal.     ED Results / Procedures / Treatments   Labs (all labs ordered are listed, but only abnormal results are displayed) Labs Reviewed - No data to display  EKG None  Radiology No results found.  Procedures .Laceration Repair  Date/Time: 10/02/2023 5:42 PM  Performed by: Peter Garter, PA Authorized by: Peter Garter, PA   Consent:    Consent obtained:  Verbal   Consent given by:  Patient   Risks, benefits, and alternatives were discussed: yes     Risks discussed:  Infection, need for additional repair and nerve damage   Alternatives discussed:  No treatment and delayed treatment Universal protocol:    Procedure explained and questions answered to patient or proxy's satisfaction: yes     Patient identity  confirmed:  Verbally with patient Anesthesia:    Anesthesia method:  None Laceration details:    Location:  Finger   Finger location:  L index finger   Length (cm):  2.6 Pre-procedure details:    Preparation:  Patient was prepped and draped in usual sterile fashion Exploration:    Limited defect created (wound extended): no     Hemostasis achieved with:  Direct pressure   Imaging outcome: foreign body not noted     Wound exploration: wound explored through full range of motion and entire depth of wound visualized     Contaminated: no   Treatment:    Area cleansed with:  Saline   Amount of cleaning:  Standard   Irrigation solution:  Sterile saline   Irrigation volume:  100cc   Irrigation method:  Syringe   Visualized foreign bodies/material removed: no     Debridement:  None   Undermining:  None   Scar revision: no   Skin repair:    Repair method:  Tissue adhesive Approximation:    Approximation:  Close Repair type:    Repair type:  Simple Post-procedure details:    Dressing:  Open (no dressing)   Procedure completion:  Tolerated well, no immediate complications     Medications Ordered in ED Medications - No data to display  ED Course/ Medical Decision Making/ A&P                                 Medical Decision Making  This patient presents to the ED for concern of laceration, this involves an extensive number of treatment options, and is a complaint that carries with it a high risk of complications and morbidity.  The differential diagnosis includes ligamentous/tendon injury, neurovascular, skin fracture, dislocation, foreign body retainment, neurovascular compromise, other   Co morbidities that complicate the patient evaluation  See HPI   Additional history obtained:  Additional history obtained from EMR External records from outside source obtained and reviewed including hospital records   Lab Tests:  N/a   Imaging Studies  ordered:  N/a   Cardiac Monitoring: / EKG:  The patient was maintained on a cardiac monitor.  I personally viewed and interpreted the cardiac monitored which showed an underlying rhythm of: sinus rhythm   Consultations Obtained:  N/a   Problem List / ED Course / Critical interventions / Medication management  Finger laceration I ordered medication including Dermabond  Reevaluation of the patient after these medicines showed that the patient improved I have reviewed the patients home medicines and have made adjustments as needed   Social Determinants of Health:  Cigar use.  Denies illicit drug use.   Test / Admission - Considered:  Finger laceration Vitals signs within normal range and stable throughout visit. 27 year old male presents emergency department complaints of laceration.  Laceration noted on the medial aspect of patient's second digit of left hand.  No obvious ligamentous/tendinous involvement sensation intact distally.  No obvious bony tenderness on exam.  Full range of motion of digit.  Laceration repaired manner as above.  Recommend local wound care at home as described in AVS.  Routine follow-up with PCP recommended for reevaluation.  Patient without complicating health risk factors so will not place on antibiotics.  Denied Tdap updated.  Treatment plan discussed at length with patient and he acknowledged understanding was agreeable to said plan.  Patient overall well-appearing, afebrile in no acute distress. Worrisome signs and symptoms were discussed with the patient, and the patient acknowledged understanding to return to the ED if noticed. Patient was stable upon discharge.          Final Clinical Impression(s) / ED Diagnoses Final diagnoses:  Laceration of left index finger without foreign body without damage to nail, initial encounter    Rx / DC Orders ED Discharge Orders     None         Peter Garter, [place] 10/02/23 1743     Alvira Monday, MD 10/03/23 1239

## 2023-10-02 NOTE — ED Triage Notes (Addendum)
Pt reports he cut his left index finger with a knife tonight when he was cooking dinner at 1700. Laceration to pad of his left index finger. Last tetanus a year ago.

## 2023-10-02 NOTE — ED Notes (Signed)
Derma bond applied by Edp. Bandage and splint applied by primary nurse per EDP order

## 2023-10-02 NOTE — Discharge Instructions (Signed)
As discussed, your laceration was repaired using tissue adhesive.  This should dissolve within the next 5 to 7 days as the wound heals.  Wear finger splint as needed for protection.  Avoid excessive moisture in area or placing hands in bodies of water or excessively washing her hands.  You may take Tylenol/Motrin for pain.  Please do not hesitate to return if the worrisome signs and symptoms we discussed become apparent.

## 2023-10-04 ENCOUNTER — Encounter (HOSPITAL_BASED_OUTPATIENT_CLINIC_OR_DEPARTMENT_OTHER): Payer: Self-pay

## 2023-10-04 ENCOUNTER — Other Ambulatory Visit: Payer: Self-pay

## 2023-10-04 ENCOUNTER — Emergency Department (HOSPITAL_BASED_OUTPATIENT_CLINIC_OR_DEPARTMENT_OTHER)
Admission: EM | Admit: 2023-10-04 | Discharge: 2023-10-04 | Disposition: A | Discharge status: Home / Self Care | Payer: Self-pay | Attending: Emergency Medicine | Admitting: Emergency Medicine

## 2023-10-04 DIAGNOSIS — X58XXXA Exposure to other specified factors, initial encounter: Secondary | ICD-10-CM | POA: Insufficient documentation

## 2023-10-04 DIAGNOSIS — S61211D Laceration without foreign body of left index finger without damage to nail, subsequent encounter: Secondary | ICD-10-CM

## 2023-10-04 DIAGNOSIS — S61211A Laceration without foreign body of left index finger without damage to nail, initial encounter: Secondary | ICD-10-CM | POA: Insufficient documentation

## 2023-10-04 NOTE — Discharge Instructions (Signed)
You were seen in the emergency department for your finger laceration.  Because your cut is a few days old but still slightly gaping we did place some skin tape called Steri-Strips.  They should fall off on their own within a few days.  You should keep your dressing on in place for at least the next 12 hours.  You can shower and wash her hand normally, just completely dry her hand and you can redress it afterwards.  You can follow-up with your primary doctor to have your wound rechecked.  You should return to the emergency department if you have pus draining from your wound, spreading redness or any other new or concerning symptoms.

## 2023-10-04 NOTE — ED Triage Notes (Signed)
Pt was seen this weekend for left index finger injury. States that the wound bond came off and that he needs to have it replaced. Can't go back to work without it.

## 2023-10-04 NOTE — ED Provider Notes (Signed)
[facility] Provider Note   CSN: [id number] Arrival date & time: 10/04/23  1233     History  Chief Complaint  Patient presents with   Finger Injury    Richard Lester is a 27 y.o. male.  Patient is a 27 year old male with no significant past medical history presenting to the emergency department for wound recheck.  The patient was seen in the ED 2 days ago for his finger laceration and states it was repaired with Dermabond.  He states when he went to change the dressing the Dermabond had stuck to the dressing and ripped off.  He states that he is unable to return to work with an open cut and wanted to have his finger reevaluated.  He states he has not had any recurrent bleeding, no drainage, erythema or warmth.  He states he still having some paresthesias at the tip of his finger.  The history is provided by the patient.       Home Medications Prior to Admission medications   Medication Sig Start Date End Date Taking? Authorizing Provider  benzonatate (TESSALON) 100 MG capsule Take 1 capsule (100 mg total) by mouth every 8 (eight) hours. 10/28/22   Blue, Soijett A, PA-C  budesonide-formoterol (SYMBICORT) 80-4.5 MCG/ACT inhaler Inhale 2 puffs into the lungs 2 (two) times daily. 11/23/22   Lonell Grandchild, MD  budesonide-formoterol (SYMBICORT) 80-4.5 MCG/ACT inhaler Inhale 2 puffs into the lungs in the morning and at bedtime. 07/13/23   Jeanelle Malling, PA  dicyclomine (BENTYL) 20 MG tablet Take 1 tablet (20 mg total) by mouth 2 (two) times daily. 02/06/19   Gerhard Munch, MD  doxycycline (VIBRAMYCIN) 100 MG capsule Take 1 capsule (100 mg total) by mouth 2 (two) times daily. One po bid x 7 days 06/15/23   Rolan Bucco, MD  ibuprofen (ADVIL) 600 MG tablet Take 1 tablet (600 mg total) by mouth every 6 (six) hours as needed for fever or mild pain. 11/23/22   Lonell Grandchild, MD      Allergies    Patient has no known allergies.    Review  of Systems   Review of Systems  Physical Exam Updated Vital Signs BP 115/64   Pulse 66   Temp 98.6 F (37 C) (Oral)   SpO2 96%  Physical Exam Vitals and nursing note reviewed.  Constitutional:      General: He is not in acute distress.    Appearance: Normal appearance.  HENT:     Head: Normocephalic.     Nose: Nose normal.  Eyes:     Extraocular Movements: Extraocular movements intact.  Cardiovascular:     Rate and Rhythm: Normal rate.  Pulmonary:     Effort: Pulmonary effort is normal.  Musculoskeletal:        General: Normal range of motion.     Cervical back: Normal range of motion.  Skin:    General: Skin is warm and dry.     Comments: Proximately 2 cm laceration to distal left index finger, minimally gaping, no bleeding, no surrounding erythema, warmth or drainage  Neurological:     General: No focal deficit present.     Mental Status: He is alert and oriented to person, place, and time.  Psychiatric:        Mood and Affect: Mood normal.        Behavior: Behavior normal.     ED Results / Procedures / Treatments   Labs (all labs  ordered are listed, but only abnormal results are displayed) Labs Reviewed - No data to display  EKG None  Radiology No results found.  Procedures .Laceration Repair  Date/Time: 10/04/2023 1:07 PM  Performed by: Rexford Maus, DO Authorized by: Rexford Maus, DO   Consent:    Consent obtained:  Verbal   Consent given by:  Patient   Risks, benefits, and alternatives were discussed: yes     Risks discussed:  Infection, need for additional repair, poor cosmetic result, pain and poor wound healing   Alternatives discussed:  No treatment Universal protocol:    Procedure explained and questions answered to patient or proxy's satisfaction: yes     Patient identity confirmed:  Verbally with patient Anesthesia:    Anesthesia method:  None Laceration details:    Location:  Finger   Finger location:  L index  finger   Length (cm):  2   Depth (mm):  1 Exploration:    Limited defect created (wound extended): yes     Hemostasis achieved with:  Direct pressure   Wound exploration: wound explored through full range of motion and entire depth of wound visualized     Wound extent: areolar tissue not violated, fascia not violated, no foreign body, no signs of injury, no nerve damage, no tendon damage, no underlying fracture and no vascular damage     Contaminated: no   Treatment:    Irrigation solution:  Sterile saline   Irrigation volume:  100 ml   Irrigation method:  Pressure wash   Visualized foreign bodies/material removed: no     Debridement:  None   Undermining:  None   Scar revision: no   Skin repair:    Repair method:  Steri-Strips   Number of Steri-Strips:  5 Approximation:    Approximation:  Close Repair type:    Repair type:  Simple Post-procedure details:    Dressing:  Bulky dressing   Procedure completion:  Tolerated well, no immediate complications     Medications Ordered in ED Medications - No data to display  ED Course/ Medical Decision Making/ A&P                                 Medical Decision Making This patient presents to the ED with chief complaint(s) of wound re-check with no pertinent past medical history which further complicates the presenting complaint. The complaint involves an extensive differential diagnosis and also carries with it a high risk of complications and morbidity.    The differential diagnosis includes laceration/wound dehiscence, no evidence of cellulitis or other infection  Additional history obtained: Additional history obtained from N/A Records reviewed recent ED records  ED Course and Reassessment: On patient's arrival, he is hemodynamically stable in no acute distress.  Does have some mild wound dehiscence.  Because his cut is greater than 48 hours old, will plan to irrigate and repair with Steri-Strips and place a clean dressing.   The patient is otherwise stable for discharge home with outpatient follow-up and was given strict return precautions.  Independent labs interpretation:  N/A  Independent visualization of imaging: -N/A  Consultation: - Consulted or discussed management/test interpretation w/ external professional: N/A  Consideration for admission or further workup: Patient has no emergent conditions requiring admission or further work-up at this time and is stable for discharge home with primary care follow-up  Social Determinants of health: N/A  Final Clinical Impression(s) / ED Diagnoses Final diagnoses:  Laceration of left index finger without foreign body without damage to nail, subsequent encounter    Rx / DC Orders ED Discharge Orders     None         Rexford Maus, DO 10/04/23 1315

## 2023-11-01 ENCOUNTER — Emergency Department (HOSPITAL_BASED_OUTPATIENT_CLINIC_OR_DEPARTMENT_OTHER)
Admission: EM | Admit: 2023-11-01 | Discharge: 2023-11-01 | Disposition: A | Discharge status: Home / Self Care | Payer: Self-pay | Attending: Emergency Medicine | Admitting: Emergency Medicine

## 2023-11-01 ENCOUNTER — Encounter (HOSPITAL_BASED_OUTPATIENT_CLINIC_OR_DEPARTMENT_OTHER): Payer: Self-pay | Admitting: Emergency Medicine

## 2023-11-01 ENCOUNTER — Other Ambulatory Visit: Payer: Self-pay

## 2023-11-01 DIAGNOSIS — R300 Dysuria: Secondary | ICD-10-CM | POA: Insufficient documentation

## 2023-11-01 DIAGNOSIS — Z202 Contact with and (suspected) exposure to infections with a predominantly sexual mode of transmission: Secondary | ICD-10-CM

## 2023-11-01 DIAGNOSIS — Z113 Encounter for screening for infections with a predominantly sexual mode of transmission: Secondary | ICD-10-CM | POA: Insufficient documentation

## 2023-11-01 LAB — URINALYSIS, ROUTINE W REFLEX MICROSCOPIC
Bilirubin Urine: NEGATIVE
Glucose, UA: NEGATIVE mg/dL
Hgb urine dipstick: NEGATIVE
Ketones, ur: NEGATIVE mg/dL
Leukocytes,Ua: NEGATIVE
Nitrite: NEGATIVE
Protein, ur: NEGATIVE mg/dL
Specific Gravity, Urine: 1.01 (ref 1.005–1.030)
pH: 7 (ref 5.0–8.0)

## 2023-11-01 MED ORDER — CEFTRIAXONE SODIUM 500 MG IJ SOLR
500.0000 mg | Freq: Once | INTRAMUSCULAR | Status: AC
  Administered 2023-11-01: 500 mg via INTRAMUSCULAR
  Filled 2023-11-01: qty 500

## 2023-11-01 MED ORDER — DOXYCYCLINE HYCLATE 100 MG PO TABS
100.0000 mg | ORAL_TABLET | Freq: Once | ORAL | Status: AC
  Administered 2023-11-01: 100 mg via ORAL
  Filled 2023-11-01: qty 1

## 2023-11-01 MED ORDER — LIDOCAINE HCL (PF) 1 % IJ SOLN
INTRAMUSCULAR | Status: AC
  Filled 2023-11-01: qty 5

## 2023-11-01 MED ORDER — DOXYCYCLINE HYCLATE 100 MG PO CAPS: 100.0000 mg | ORAL_CAPSULE | Freq: Two times a day (BID) | ORAL | 0 refills | Status: DC

## 2023-11-01 NOTE — ED Provider Notes (Signed)
[facility] [facility] Provider Note   CSN: [id number] Arrival date & time: 11/01/23  5784     History  Chief Complaint  Patient presents with   Exposure to STD    Richard Lester is a 28 y.o. male.  Patient here with urine for STDs.  He denies any symptoms except maybe some slight dysuria.  States he was going through his girlfriend's phone and thought she was texting another man and he became concerned that she was cheating on him.  He denies any known STDs from his girlfriend or himself.  Denies any penile discharge.  No abdominal pain, back pain, fever, chills, nausea, vomiting.  No rashes.  No testicular pain.  No fever.  Maybe a slight bit of dysuria.  The history is provided by the patient.  Exposure to STD Pertinent negatives include no chest pain, no abdominal pain, no headaches and no shortness of breath.       Home Medications Prior to Admission medications   Medication Sig Start Date End Date Taking? Authorizing Provider  benzonatate (TESSALON) 100 MG capsule Take 1 capsule (100 mg total) by mouth every 8 (eight) hours. 10/28/22   Blue, Soijett A, PA-C  budesonide-formoterol (SYMBICORT) 80-4.5 MCG/ACT inhaler Inhale 2 puffs into the lungs 2 (two) times daily. 11/23/22   Lonell Grandchild, MD  budesonide-formoterol (SYMBICORT) 80-4.5 MCG/ACT inhaler Inhale 2 puffs into the lungs in the morning and at bedtime. 07/13/23   Jeanelle Malling, PA  dicyclomine (BENTYL) 20 MG tablet Take 1 tablet (20 mg total) by mouth 2 (two) times daily. 02/06/19   Gerhard Munch, MD  doxycycline (VIBRAMYCIN) 100 MG capsule Take 1 capsule (100 mg total) by mouth 2 (two) times daily. One po bid x 7 days 06/15/23   Rolan Bucco, MD  ibuprofen (ADVIL) 600 MG tablet Take 1 tablet (600 mg total) by mouth every 6 (six) hours as needed for fever or mild pain. 11/23/22   Lonell Grandchild, MD      Allergies    Patient has no known allergies.    Review of Systems    Review of Systems  Constitutional:  Negative for activity change, appetite change and fever.  HENT:  Negative for congestion and rhinorrhea.   Respiratory:  Negative for cough, chest tightness and shortness of breath.   Cardiovascular:  Negative for chest pain.  Gastrointestinal:  Negative for abdominal pain, nausea and vomiting.  Genitourinary:  Positive for dysuria. Negative for flank pain, penile discharge, testicular pain and urgency.  Musculoskeletal:  Negative for arthralgias and myalgias.  Skin:  Negative for rash.  Neurological:  Negative for dizziness, weakness and headaches.   all other systems are negative except as noted in the HPI and PMH.    Physical Exam Updated Vital Signs BP (!) 134/92   Pulse 69   Temp 98.5 F (36.9 C) (Oral)   Resp 19   SpO2 100%  Physical Exam Vitals and nursing note reviewed.  Constitutional:      General: He is not in acute distress.    Appearance: He is well-developed.  HENT:     Head: Normocephalic and atraumatic.     Mouth/Throat:     Pharynx: No oropharyngeal exudate.  Eyes:     Conjunctiva/sclera: Conjunctivae normal.     Pupils: Pupils are equal, round, and reactive to light.  Neck:     Comments: No meningismus. Cardiovascular:     Rate and Rhythm: Normal rate and regular rhythm.  Heart sounds: Normal heart sounds. No murmur heard. Pulmonary:     Effort: Pulmonary effort is normal. No respiratory distress.     Breath sounds: Normal breath sounds.  Abdominal:     Palpations: Abdomen is soft.     Tenderness: There is no abdominal tenderness. There is no guarding or rebound.  Genitourinary:    Comments: Declines GU exam Musculoskeletal:        General: No tenderness. Normal range of motion.     Cervical back: Normal range of motion and neck supple.  Skin:    General: Skin is warm.  Neurological:     Mental Status: He is alert and oriented to person, place, and time.     Cranial Nerves: No cranial nerve deficit.      Motor: No abnormal muscle tone.     Coordination: Coordination normal.     Comments:  5/5 strength throughout. CN 2-12 intact.Equal grip strength.   Psychiatric:        Behavior: Behavior normal.     ED Results / Procedures / Treatments   Labs (all labs ordered are listed, but only abnormal results are displayed) Labs Reviewed  URINALYSIS, ROUTINE W REFLEX MICROSCOPIC    EKG None  Radiology No results found.  Procedures Procedures    Medications Ordered in ED Medications  cefTRIAXone (ROCEPHIN) injection 500 mg (has no administration in time range)  doxycycline (VIBRA-TABS) tablet 100 mg (has no administration in time range)    ED Course/ Medical Decision Making/ A&P                                 Medical Decision Making Amount and/or Complexity of Data Reviewed Labs: ordered. Decision-making details documented in ED Course. Radiology: ordered and independent interpretation performed. Decision-making details documented in ED Course. ECG/medicine tests: ordered and independent interpretation performed. Decision-making details documented in ED Course.  Risk Prescription drug management.   Patient concern for STDs after findings text on his girlfriend's phone.  Denies symptoms other than some slight dysuria.  Declines GU exam.  No abdominal pain or flank pain.  No back pain.  Declines blood work.  Urinalysis not suspicious for infection.  Patient requesting empiric treatment for both GC and chlamydia.  Treatment with empiric IM Rocephin and p.o. doxycycline.  Patient encouraged to use condom with every sexual encounter.  Follow-up with the health department.  Return precautions discussed.       Final Clinical Impression(s) / ED Diagnoses Final diagnoses:  Possible exposure to STD    Rx / DC Orders ED Discharge Orders     None         Montrez Marietta, Jeannett Senior, MD 11/01/23 [phone]

## 2023-11-01 NOTE — ED Triage Notes (Signed)
Pt reports going through his girlfriends phone & she has been cheating so he wants to be tested for STDs

## 2023-11-01 NOTE — Discharge Instructions (Signed)
You are treated for both gonorrhea and chlamydia today.  Use a condom for every sexual encounter.  Follow-up with the health department for further assessment.  You declined blood work today.  Return to the ED with new or worsening symptoms.

## 2023-11-02 LAB — GC/CHLAMYDIA PROBE AMP (~~LOC~~) NOT AT ARMC
Chlamydia: NEGATIVE
Comment: NEGATIVE
Comment: NORMAL
Neisseria Gonorrhea: NEGATIVE

## 2024-05-17 ENCOUNTER — Other Ambulatory Visit: Payer: Self-pay

## 2024-05-17 ENCOUNTER — Encounter (HOSPITAL_BASED_OUTPATIENT_CLINIC_OR_DEPARTMENT_OTHER): Payer: Self-pay

## 2024-05-17 ENCOUNTER — Emergency Department (HOSPITAL_BASED_OUTPATIENT_CLINIC_OR_DEPARTMENT_OTHER)
Admission: EM | Admit: 2024-05-17 | Discharge: 2024-05-17 | Disposition: A | Discharge status: Home / Self Care | Attending: Emergency Medicine | Admitting: Emergency Medicine

## 2024-05-17 ENCOUNTER — Other Ambulatory Visit (HOSPITAL_BASED_OUTPATIENT_CLINIC_OR_DEPARTMENT_OTHER): Payer: Self-pay

## 2024-05-17 DIAGNOSIS — Z7951 Long term (current) use of inhaled steroids: Secondary | ICD-10-CM | POA: Diagnosis not present

## 2024-05-17 DIAGNOSIS — J45909 Unspecified asthma, uncomplicated: Secondary | ICD-10-CM | POA: Diagnosis not present

## 2024-05-17 DIAGNOSIS — M546 Pain in thoracic spine: Secondary | ICD-10-CM | POA: Insufficient documentation

## 2024-05-17 DIAGNOSIS — R059 Cough, unspecified: Secondary | ICD-10-CM | POA: Diagnosis present

## 2024-05-17 DIAGNOSIS — J069 Acute upper respiratory infection, unspecified: Secondary | ICD-10-CM | POA: Insufficient documentation

## 2024-05-17 LAB — RESP PANEL BY RT-PCR (RSV, FLU A&B, COVID)  RVPGX2
Influenza A by PCR: NEGATIVE
Influenza B by PCR: NEGATIVE
Resp Syncytial Virus by PCR: NEGATIVE
SARS Coronavirus 2 by RT PCR: NEGATIVE

## 2024-05-17 MED ORDER — IBUPROFEN 600 MG PO TABS
600.0000 mg | ORAL_TABLET | Freq: Four times a day (QID) | ORAL | 0 refills | Status: AC | PRN
Start: 2024-05-17 — End: ?
  Filled 2024-05-17: qty 30, 8d supply, fill #0

## 2024-05-17 MED ORDER — ALBUTEROL SULFATE HFA 108 (90 BASE) MCG/ACT IN AERS
1.0000 | INHALATION_SPRAY | Freq: Four times a day (QID) | RESPIRATORY_TRACT | 1 refills | Status: AC | PRN
Start: 2024-05-17 — End: ?
  Filled 2024-05-17: qty 18, 25d supply, fill #0

## 2024-05-17 MED ORDER — IBUPROFEN 400 MG PO TABS
600.0000 mg | ORAL_TABLET | Freq: Once | ORAL | Status: AC
  Administered 2024-05-17: 600 mg via ORAL
  Filled 2024-05-17: qty 1

## 2024-05-17 MED ORDER — BENZONATATE 100 MG PO CAPS
100.0000 mg | ORAL_CAPSULE | Freq: Three times a day (TID) | ORAL | 0 refills | Status: AC
  Filled 2024-05-17: qty 21, 7d supply, fill #0

## 2024-05-17 MED ORDER — BENZONATATE 100 MG PO CAPS
100.0000 mg | ORAL_CAPSULE | Freq: Once | ORAL | Status: AC
  Administered 2024-05-17: 100 mg via ORAL
  Filled 2024-05-17: qty 1

## 2024-05-17 MED ORDER — ALBUTEROL SULFATE HFA 108 (90 BASE) MCG/ACT IN AERS
2.0000 | INHALATION_SPRAY | Freq: Once | RESPIRATORY_TRACT | Status: AC
  Administered 2024-05-17: 2 via RESPIRATORY_TRACT
  Filled 2024-05-17: qty 6.7

## 2024-05-17 NOTE — ED Notes (Signed)
 Patient given discharge instructions. Questions were answered. Patient verbalized understanding of discharge instructions and care at home.

## 2024-05-17 NOTE — ED Provider Notes (Signed)
 [place] [facility] Provider Note   CSN: [id number] Arrival date & time: 05/17/24  1100     Patient presents with: Cough and Nasal Congestion   Richard Lester is a 28 y.o. male with a past medical history significant for asthma who presents to the ED due to cough, rhinorrhea, and scratchy throat x 1 day.  Also admits to some upper back pain.  Endorses a subjective fever yesterday.  Denies abdominal pain, nausea, vomiting, diarrhea.  No sick contacts.  Denies any wheezing. No chest pain.   History obtained from patient and past medical records. No interpreter used during encounter.       Prior to Admission medications   Medication Sig Start Date End Date Taking? Authorizing Provider  albuterol  (VENTOLIN  HFA) 108 (90 Base) MCG/ACT inhaler Inhale 1-2 puffs into the lungs every 6 (six) hours as needed for wheezing or shortness of breath. 05/17/24  Yes Jashan Cotten, Aleck BROCKS, PA-C  benzonatate  (TESSALON ) 100 MG capsule Take 1 capsule (100 mg total) by mouth every 8 (eight) hours. 05/17/24  Yes Lyberti Thrush C, PA-C  ibuprofen  (ADVIL ) 600 MG tablet Take 1 tablet (600 mg total) by mouth every 6 (six) hours as needed. 05/17/24  Yes Brooks Kinnan C, PA-C  benzonatate  (TESSALON ) 100 MG capsule Take 1 capsule (100 mg total) by mouth every 8 (eight) hours. 10/28/22   Blue, Soijett A, PA-C  budesonide -formoterol  (SYMBICORT ) 80-4.5 MCG/ACT inhaler Inhale 2 puffs into the lungs 2 (two) times daily. 11/23/22   Francesca Elsie CROME, MD  budesonide -formoterol  (SYMBICORT ) 80-4.5 MCG/ACT inhaler Inhale 2 puffs into the lungs in the morning and at bedtime. 07/13/23   Ladora Congress, PA  dicyclomine  (BENTYL ) 20 MG tablet Take 1 tablet (20 mg total) by mouth 2 (two) times daily. 02/06/19   Garrick Charleston, MD  doxycycline  (VIBRAMYCIN ) 100 MG capsule Take 1 capsule (100 mg total) by mouth 2 (two) times daily. 11/01/23   Rancour, Garnette, MD  ibuprofen  (ADVIL ) 600 MG tablet Take 1  tablet (600 mg total) by mouth every 6 (six) hours as needed for fever or mild pain. 11/23/22   Francesca Elsie CROME, MD    Allergies: Patient has no known allergies.    Review of Systems  Constitutional:  Positive for chills and fever.  HENT:  Positive for rhinorrhea and sore throat.   Respiratory:  Positive for cough. Negative for shortness of breath and wheezing.   Cardiovascular:  Negative for chest pain.  Gastrointestinal:  Negative for abdominal pain.    Updated Vital Signs BP (!) 131/91   Pulse 75   Temp 99.3 F (37.4 C) (Oral)   Resp 20   Ht 5' 11 (1.803 m)   Wt 72.6 kg   SpO2 98%   BMI 22.32 kg/m   Physical Exam Vitals and nursing note reviewed.  Constitutional:      General: He is not in acute distress.    Appearance: He is not ill-appearing.  HENT:     Head: Normocephalic.     Mouth/Throat:     Comments: Throat with mild erythema.  Uvula midline.  No tonsillar hypertrophy or exudates.  Normal phonation. Eyes:     Pupils: Pupils are equal, round, and reactive to light.  Neck:     Comments: No meningismus. Cardiovascular:     Rate and Rhythm: Normal rate and regular rhythm.     Pulses: Normal pulses.     Heart sounds: Normal heart sounds. No murmur heard.  No friction rub. No gallop.  Pulmonary:     Effort: Pulmonary effort is normal.     Breath sounds: Normal breath sounds.     Comments: Respirations equal and unlabored, patient able to speak in full sentences, lungs clear to auscultation bilaterally Abdominal:     General: Abdomen is flat. There is no distension.     Palpations: Abdomen is soft.     Tenderness: There is no abdominal tenderness. There is no guarding or rebound.  Musculoskeletal:        General: Normal range of motion.     Cervical back: Neck supple.  Skin:    General: Skin is warm and dry.  Neurological:     General: No focal deficit present.     Mental Status: He is alert.  Psychiatric:        Mood and Affect: Mood normal.         Behavior: Behavior normal.     (all labs ordered are listed, but only abnormal results are displayed) Labs Reviewed  RESP PANEL BY RT-PCR (RSV, FLU A&B, COVID)  [id number]    EKG: None  Radiology: No results found.   Procedures   Medications Ordered in the ED  ibuprofen  (ADVIL ) tablet 600 mg (has no administration in time range)  benzonatate  (TESSALON ) capsule 100 mg (has no administration in time range)                                    Medical Decision Making Risk Prescription drug management.   28 year old male presents to the ED due to cough, rhinorrhea, and sore throat x 1 day.  Also admits to some myalgias.  History of asthma however, denies shortness of breath and wheezing.  Patient ran out of his albuterol  inhaler and is requesting a refill.  Refill sent to pharmacy.  Upon arrival, patient afebrile, not tachycardic or hypoxic.  Patient well-appearing on exam.  Lungs clear to auscultation bilaterally.  No wheezing.  Some tenderness throughout right trapezius muscle.  Throat with mild erythema.  No tonsillar hypertrophy or exudates.  No abscess.  No meningismus to suggest meningitis.  RVP ordered.  Suspect symptoms related to viral etiology.  Patient treated symptomatically here in the ED and discharged with prescription medications.  Advised patient to check RVP results on MyChart.  No evidence of respiratory distress.  No wheezing to suggest asthma exacerbation so will hold off on prednisone  at this time.  Patient stable for discharge. Strict ED precautions discussed with patient. Patient states understanding and agrees to plan. Patient discharged home in no acute distress and stable vitals      Final diagnoses:  URI with cough and congestion  Asthma, unspecified asthma severity, unspecified whether complicated, unspecified whether persistent    ED Discharge Orders          Ordered    benzonatate  (TESSALON ) 100 MG capsule  Every 8 hours        05/17/24 1121     ibuprofen  (ADVIL ) 600 MG tablet  Every 6 hours PRN        05/17/24 1121    albuterol  (VENTOLIN  HFA) 108 (90 Base) MCG/ACT inhaler  Every 6 hours PRN        05/17/24 1121               Lorelle Aleck BROCKS, PA-C 05/17/24 1124    Jerrol Agent, MD 05/17/24 1138

## 2024-05-17 NOTE — ED Triage Notes (Signed)
 Arrives POV with complaints of scratchy throat, cough, and runny nose x1 day. Also reporting some upper back pain.

## 2024-05-17 NOTE — Discharge Instructions (Addendum)
 It was a pleasure taking care of you today.  As discussed, I suspect you have a viral infection causing your symptoms.  I am sending you home with cough medication and pain medication.  Take as needed.  I have also refilled your albuterol  inhaler.  Your results for the viral panel will be available on MyChart within the next few hours.  Return to the ER for any worsening symptoms.

## 2024-06-14 ENCOUNTER — Emergency Department (HOSPITAL_BASED_OUTPATIENT_CLINIC_OR_DEPARTMENT_OTHER)
Admission: EM | Admit: 2024-06-14 | Discharge: 2024-06-14 | Disposition: A | Discharge status: Home / Self Care | Attending: Emergency Medicine | Admitting: Emergency Medicine

## 2024-06-14 ENCOUNTER — Encounter (HOSPITAL_BASED_OUTPATIENT_CLINIC_OR_DEPARTMENT_OTHER): Payer: Self-pay

## 2024-06-14 ENCOUNTER — Other Ambulatory Visit: Payer: Self-pay

## 2024-06-14 ENCOUNTER — Emergency Department (HOSPITAL_BASED_OUTPATIENT_CLINIC_OR_DEPARTMENT_OTHER)

## 2024-06-14 DIAGNOSIS — Y92410 Unspecified street and highway as the place of occurrence of the external cause: Secondary | ICD-10-CM | POA: Diagnosis not present

## 2024-06-14 DIAGNOSIS — M25561 Pain in right knee: Secondary | ICD-10-CM | POA: Insufficient documentation

## 2024-06-14 DIAGNOSIS — J45909 Unspecified asthma, uncomplicated: Secondary | ICD-10-CM | POA: Diagnosis not present

## 2024-06-14 MED ORDER — METHOCARBAMOL 500 MG PO TABS: 500.0000 mg | ORAL_TABLET | Freq: Two times a day (BID) | ORAL | 0 refills | Status: AC

## 2024-06-14 MED ORDER — KETOROLAC TROMETHAMINE 15 MG/ML IJ SOLN
15.0000 mg | Freq: Once | INTRAMUSCULAR | Status: AC
  Administered 2024-06-14: 15 mg via INTRAMUSCULAR
  Filled 2024-06-14: qty 1

## 2024-06-14 MED ORDER — METHOCARBAMOL 500 MG PO TABS
500.0000 mg | ORAL_TABLET | Freq: Once | ORAL | Status: AC
  Administered 2024-06-14: 500 mg via ORAL
  Filled 2024-06-14: qty 1

## 2024-06-14 MED ORDER — NAPROXEN 500 MG PO TABS
500.0000 mg | ORAL_TABLET | Freq: Two times a day (BID) | ORAL | 0 refills | Status: AC
Start: 2024-06-14 — End: ?

## 2024-06-14 MED ORDER — IBUPROFEN 400 MG PO TABS
400.0000 mg | ORAL_TABLET | Freq: Once | ORAL | Status: AC | PRN
  Administered 2024-06-14: 400 mg via ORAL
  Filled 2024-06-14: qty 1
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
## 2024-06-14 NOTE — ED Notes (Signed)
 Patient transported to X-ray

## 2024-06-14 NOTE — Discharge Instructions (Signed)
 It was a pleasure taking care of you today.  As discussed, your x-ray did not show any broken bones.  Continue to use knee sleeve and crutches as needed for comfort.  Ice and elevate your right leg.  I am sending you home with pain medication and a muscle relaxer.  Take as needed for pain.  Muscle relaxer can cause drowsiness so do not drive or operate machinery while on the medication.  Return to the ER for any worsening symptoms.

## 2024-06-14 NOTE — ED Notes (Signed)
 Reviewed discharge instructions, follow up and pain management with pt. Pt states understanding. Pt able to safely ambulate with knee brace and crutches at discharge
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
## 2024-06-14 NOTE — ED Notes (Signed)
 Ambulation completed per EDP verbal ordered. Pt tolerated ambulation w crutches

## 2024-08-14 ENCOUNTER — Emergency Department (HOSPITAL_BASED_OUTPATIENT_CLINIC_OR_DEPARTMENT_OTHER)
Admission: EM | Admit: 2024-08-14 | Discharge: 2024-08-14 | Disposition: A | Discharge status: Home / Self Care | Attending: Emergency Medicine | Admitting: Emergency Medicine

## 2024-08-14 ENCOUNTER — Other Ambulatory Visit: Payer: Self-pay

## 2024-08-14 ENCOUNTER — Emergency Department (HOSPITAL_BASED_OUTPATIENT_CLINIC_OR_DEPARTMENT_OTHER)

## 2024-08-14 DIAGNOSIS — R059 Cough, unspecified: Secondary | ICD-10-CM | POA: Insufficient documentation

## 2024-08-14 DIAGNOSIS — R0602 Shortness of breath: Secondary | ICD-10-CM

## 2024-08-14 DIAGNOSIS — J4541 Moderate persistent asthma with (acute) exacerbation: Secondary | ICD-10-CM | POA: Insufficient documentation

## 2024-08-14 LAB — SARS CORONAVIRUS 2 BY RT PCR: SARS Coronavirus 2 by RT PCR: NEGATIVE

## 2024-08-14 MED ORDER — ALBUTEROL SULFATE (2.5 MG/3ML) 0.083% IN NEBU
INHALATION_SOLUTION | RESPIRATORY_TRACT | Status: AC
  Administered 2024-08-14: 2.5 mg
  Filled 2024-08-14: qty 3

## 2024-08-14 MED ORDER — ALBUTEROL SULFATE HFA 108 (90 BASE) MCG/ACT IN AERS
INHALATION_SPRAY | RESPIRATORY_TRACT | Status: AC
  Administered 2024-08-14: 2
  Filled 2024-08-14: qty 6.7

## 2024-08-14 MED ORDER — DOXYCYCLINE HYCLATE 100 MG PO CAPS: 100.0000 mg | ORAL_CAPSULE | Freq: Two times a day (BID) | ORAL | 0 refills | Status: AC

## 2024-08-14 MED ORDER — ACETAMINOPHEN 325 MG PO TABS
650.0000 mg | ORAL_TABLET | Freq: Once | ORAL | Status: AC | PRN
  Administered 2024-08-14: 650 mg via ORAL
  Filled 2024-08-14: qty 2

## 2024-08-14 MED ORDER — PREDNISONE 20 MG PO TABS: 40.0000 mg | ORAL_TABLET | Freq: Every day | ORAL | 0 refills | Status: AC

## 2024-08-14 MED ORDER — IPRATROPIUM-ALBUTEROL 0.5-2.5 (3) MG/3ML IN SOLN
RESPIRATORY_TRACT | Status: AC
  Administered 2024-08-14: 3 mL
  Filled 2024-08-14: qty 3

## 2024-08-14 NOTE — ED Triage Notes (Addendum)
 C/o cough the past few days, states flared his asthma. Ran out of inhaler. Audible wheezing in triage. Short of breath, chest tightness

## 2024-08-14 NOTE — ED Notes (Signed)
 Patient stated he was out of MDI at home and has been taking his child's nebs without relief. BBS exp wheezes. Neb treatment given upon arrival to room, and MDI given to take home.

## 2024-08-14 NOTE — ED Provider Notes (Signed)
 [facility] Provider Note   CSN: [id number] Arrival date & time: 08/14/24  9148     Patient presents with: Shortness of Breath   Richard Lester is a 28 y.o. male.   28 y.o male with a PMH of Asthma presents to the ED with a chief complaint of cough, congestion for the past 2 weeks.  Patient reports he has been experiencing a wet cough, with some mild productive sputum clear to white.  He does have an underlying history of asthma, reports he has not been using his inhaler he has been taking his child's nebulizer treatments.  He also arrived to the ED febrile with a low-grade temp of 100.5, has not been taking any over-the-counter medication.  He also tells me that he endorses tobacco use sometimes. He has not seen a PCP in sometime. Has some shortness of breath with activity. No other complaints reported.   The history is provided by the patient.  Shortness of Breath Associated symptoms: cough and fever   Associated symptoms: no abdominal pain, no chest pain and no vomiting        Prior to Admission medications   Medication Sig Start Date End Date Taking? Authorizing Provider  doxycycline  (VIBRAMYCIN ) 100 MG capsule Take 1 capsule (100 mg total) by mouth 2 (two) times daily for 7 days. 08/14/24 08/21/24 Yes Liann Spaeth, PA-C  predniSONE  (DELTASONE ) 20 MG tablet Take 2 tablets (40 mg total) by mouth daily for 5 days. 08/14/24 08/19/24 Yes Krithik Mapel, PA-C  albuterol  (VENTOLIN  HFA) 108 (90 Base) MCG/ACT inhaler Inhale 1-2 puffs into the lungs every 6 (six) hours as needed for wheezing or shortness of breath. 05/17/24   Aberman, Caroline C, PA-C  benzonatate  (TESSALON ) 100 MG capsule Take 1 capsule (100 mg total) by mouth every 8 (eight) hours. 10/28/22   Blue, Soijett A, PA-C  benzonatate  (TESSALON ) 100 MG capsule Take 1 capsule (100 mg total) by mouth every 8 (eight) hours. 05/17/24   Aberman, Caroline C, PA-C  budesonide -formoterol  (SYMBICORT )  80-4.5 MCG/ACT inhaler Inhale 2 puffs into the lungs 2 (two) times daily. 11/23/22   Francesca Elsie CROME, MD  budesonide -formoterol  (SYMBICORT ) 80-4.5 MCG/ACT inhaler Inhale 2 puffs into the lungs in the morning and at bedtime. 07/13/23   Ladora Congress, PA  dicyclomine  (BENTYL ) 20 MG tablet Take 1 tablet (20 mg total) by mouth 2 (two) times daily. 02/06/19   Garrick Charleston, MD  ibuprofen  (ADVIL ) 600 MG tablet Take 1 tablet (600 mg total) by mouth every 6 (six) hours as needed for fever or mild pain. 11/23/22   Francesca Elsie CROME, MD  ibuprofen  (ADVIL ) 600 MG tablet Take 1 tablet (600 mg total) by mouth every 6 (six) hours as needed. 05/17/24   Aberman, Caroline C, PA-C  methocarbamol  (ROBAXIN ) 500 MG tablet Take 1 tablet (500 mg total) by mouth 2 (two) times daily. 06/14/24   Aberman, Caroline C, PA-C  naproxen  (NAPROSYN ) 500 MG tablet Take 1 tablet (500 mg total) by mouth 2 (two) times daily. 06/14/24   Aberman, Caroline C, PA-C    Allergies: Patient has no known allergies.    Review of Systems  Constitutional:  Positive for fever. Negative for chills.  Respiratory:  Positive for cough and shortness of breath.   Cardiovascular:  Negative for chest pain and leg swelling.  Gastrointestinal:  Negative for abdominal pain, nausea and vomiting.  Genitourinary:  Negative for flank pain.  Musculoskeletal:  Negative for back pain.  All  other systems reviewed and are negative.   Updated Vital Signs BP 106/67 (BP Location: Right Arm)   Pulse (!) 110   Temp (!) 100.9 F (38.3 C) (Oral)   Resp 20   Ht 5' 11 (1.803 m)   Wt 68 kg   SpO2 97%   BMI 20.92 kg/m   Physical Exam Vitals and nursing note reviewed.  Constitutional:      General: He is not in acute distress.    Appearance: He is well-developed.  HENT:     Head: Normocephalic and atraumatic.     Mouth/Throat:     Mouth: Mucous membranes are moist.  Cardiovascular:     Rate and Rhythm: Normal rate.  Pulmonary:     Effort: Pulmonary effort is  normal.     Breath sounds: Wheezing present. No decreased breath sounds.  Chest:     Chest wall: No tenderness.  Abdominal:     Palpations: Abdomen is soft.     Tenderness: There is no abdominal tenderness.  Musculoskeletal:     Cervical back: Normal range of motion and neck supple.     Right lower leg: No edema.     Left lower leg: No edema.  Skin:    General: Skin is warm and dry.  Neurological:     Mental Status: He is alert and oriented to person, place, and time.     (all labs ordered are listed, but only abnormal results are displayed) Labs Reviewed  SARS CORONAVIRUS 2 BY RT PCR    EKG: None  Radiology: DG Chest 2 View Result Date: 08/14/2024 EXAM: 2 VIEW(S) XRAY OF THE CHEST 08/14/2024 10:51:00 AM COMPARISON: 07/13/2023 CLINICAL HISTORY: coughx 2 weeks FINDINGS: LUNGS AND PLEURA: No focal pulmonary opacity. No pulmonary edema. No pleural effusion. No pneumothorax. HEART AND MEDIASTINUM: No acute abnormality of the cardiac and mediastinal silhouettes. BONES AND SOFT TISSUES: No acute osseous abnormality. IMPRESSION: 1. No acute cardiopulmonary process. Electronically signed by: Norleen Boxer MD 08/14/2024 12:22 PM EST RP Workstation: [id number]     Procedures   Medications Ordered in the ED  ipratropium-albuterol  (DUONEB) 0.5-2.5 (3) MG/3ML nebulizer solution (3 mLs  Given 08/14/24 0906)  albuterol  (PROVENTIL ) (2.5 MG/3ML) 0.083% nebulizer solution (2.5 mg  Given 08/14/24 0906)  albuterol  (VENTOLIN  HFA) 108 (90 Base) MCG/ACT inhaler (2 puffs  Given 08/14/24 0906)  acetaminophen  (TYLENOL ) tablet 650 mg (650 mg Oral Given 08/14/24 0948)    Clinical Course as of 08/14/24 1226  Mon Aug 14, 2024  1216 SARS Coronavirus 2 by RT PCR: NEGATIVE [JS]    Clinical Course User Index [JS] Takari Duncombe, PA-C                                 Medical Decision Making Amount and/or Complexity of Data Reviewed Radiology: ordered.  Risk OTC drugs. Prescription drug  management.    Patient presented to ED with chief complaint of shortness of breath which has been going for the past 2 days, also endorsing a productive cough.  Underlying history of asthma, continues to have tobacco use, he reports he has been using his child's inhaler along with nebulized therapy with mild improvement in symptoms.  Has not had any fever although he arrived with a low-grade temp of 100.9 here.  Slight tachycardia with a heart of 110, given Tylenol  while in the emergency department.  Respiratory therapist evaluated patient with some mild wheezing noted.  He was  given a breathing treatment while in the ED with improvement in his symptoms.  He reports he does not have an inhaler which was dispensed to him.  Tested for COVID-19 which is negative today.  X-ray of his chest did not show any acute findings such as pneumonia, pleural effusion, versus other etiology.  I do feel that seeing as patient has had symptoms for 2 weeks he warrants some therapy at this time.  Will treat for asthma exacerbation with steroids along with Doxy.  He does continue to endorse tobacco use which we discussed discontinuing this.  Vitals are otherwise stable without any hypoxia.  Patient stable for discharge.   Portions of this note were generated with Scientist, clinical (histocompatibility and immunogenetics). Dictation errors may occur despite best attempts at proofreading.    Final diagnoses:  Shortness of breath  Moderate persistent asthma with exacerbation    ED Discharge Orders          Ordered    predniSONE  (DELTASONE ) 20 MG tablet  Daily        08/14/24 1212    doxycycline  (VIBRAMYCIN ) 100 MG capsule  2 times daily        08/14/24 1212               Jerremy Maione, PA-C 08/14/24 1226    Long, Joshua G, MD 08/16/24 [phone]

## 2024-08-14 NOTE — Discharge Instructions (Signed)
 You are given a prescription for steroids, please take 2 tablets daily for the next 5 days.  Please be aware this medication can cause flushness, insomnia, appetite changes.  In addition, you will be treated with antibiotics, please take 1 tablet twice a day for the next 7 days.  You experience any worsening symptoms please return to the emergency department.
